# Patient Record
Sex: Male | Born: 1965 | Race: White | Hispanic: No | Marital: Married | Smoking: Never smoker
Health system: Southern US, Community
[De-identification: ages and names within clinical notes are randomized; demographics above are authoritative.]

## PROBLEM LIST (undated history)

## (undated) DIAGNOSIS — K219 Gastro-esophageal reflux disease without esophagitis: Secondary | ICD-10-CM

## (undated) DIAGNOSIS — N200 Calculus of kidney: Secondary | ICD-10-CM

## (undated) DIAGNOSIS — IMO0001 Reserved for inherently not codable concepts without codable children: Secondary | ICD-10-CM

## (undated) HISTORY — PX: WRIST SURGERY: SHX841

## (undated) HISTORY — DX: Calculus of kidney: N20.0

---

## 2000-09-04 ENCOUNTER — Emergency Department (HOSPITAL_COMMUNITY): Admission: EM | Admit: 2000-09-04 | Discharge: 2000-09-05 | Payer: Self-pay | Admitting: *Deleted

## 2003-05-01 ENCOUNTER — Emergency Department (HOSPITAL_COMMUNITY): Admission: EM | Admit: 2003-05-01 | Discharge: 2003-05-01 | Payer: Self-pay | Admitting: Emergency Medicine

## 2006-11-05 ENCOUNTER — Emergency Department (HOSPITAL_COMMUNITY): Admission: EM | Admit: 2006-11-05 | Discharge: 2006-11-05 | Payer: Self-pay | Admitting: Emergency Medicine

## 2008-02-13 ENCOUNTER — Ambulatory Visit: Payer: Self-pay | Admitting: Family Medicine

## 2008-02-13 DIAGNOSIS — M67919 Unspecified disorder of synovium and tendon, unspecified shoulder: Secondary | ICD-10-CM | POA: Insufficient documentation

## 2008-02-13 DIAGNOSIS — Z87442 Personal history of urinary calculi: Secondary | ICD-10-CM | POA: Insufficient documentation

## 2008-02-13 DIAGNOSIS — R209 Unspecified disturbances of skin sensation: Secondary | ICD-10-CM | POA: Insufficient documentation

## 2008-02-13 DIAGNOSIS — M719 Bursopathy, unspecified: Secondary | ICD-10-CM

## 2008-02-17 LAB — CONVERTED CEMR LAB
CO2: 30 meq/L (ref 19–32)
Calcium: 9.7 mg/dL (ref 8.4–10.5)
Direct LDL: 156 mg/dL
GFR calc Af Amer: 106 mL/min
GFR calc non Af Amer: 88 mL/min
Glucose, Bld: 91 mg/dL (ref 70–99)
Sodium: 139 meq/L (ref 135–145)
Total Bilirubin: 1 mg/dL (ref 0.3–1.2)
Total CHOL/HDL Ratio: 4.7
VLDL: 33 mg/dL (ref 0–40)
Vitamin B-12: 348 pg/mL (ref 211–911)

## 2008-05-22 ENCOUNTER — Ambulatory Visit: Payer: Self-pay | Admitting: Family Medicine

## 2008-05-24 LAB — CONVERTED CEMR LAB
HDL: 45.2 mg/dL (ref >39.0)
Triglycerides: 213 mg/dL (ref 0–149)
VLDL: 43 mg/dL — ABNORMAL HIGH (ref 0–40)

## 2008-08-24 ENCOUNTER — Ambulatory Visit: Payer: Self-pay | Admitting: Family Medicine

## 2008-08-29 LAB — CONVERTED CEMR LAB
Cholesterol: 240 mg/dL (ref 0–200)
Total CHOL/HDL Ratio: 5.1
Triglycerides: 162 mg/dL — ABNORMAL HIGH (ref 0–149)
VLDL: 32 mg/dL (ref 0–40)

## 2008-08-30 ENCOUNTER — Telehealth: Payer: Self-pay | Admitting: Family Medicine

## 2008-12-12 ENCOUNTER — Ambulatory Visit: Payer: Self-pay | Admitting: Family Medicine

## 2008-12-19 LAB — CONVERTED CEMR LAB
ALT: 30 units/L (ref 0–53)
AST: 26 units/L (ref 0–37)
Direct LDL: 151.5 mg/dL
VLDL: 48 mg/dL — ABNORMAL HIGH (ref 0–40)

## 2009-02-15 ENCOUNTER — Ambulatory Visit: Payer: Self-pay | Admitting: Family Medicine

## 2009-02-21 ENCOUNTER — Ambulatory Visit: Payer: Self-pay | Admitting: Family Medicine

## 2009-02-21 LAB — CONVERTED CEMR LAB
ALT: 57 units/L — ABNORMAL HIGH (ref 0–53)
AST: 34 units/L (ref 0–37)
Cholesterol: 173 mg/dL (ref 0–200)
VLDL: 22.2 mg/dL (ref 0.0–40.0)

## 2009-10-08 ENCOUNTER — Ambulatory Visit: Payer: Self-pay | Admitting: Family Medicine

## 2009-10-08 DIAGNOSIS — J309 Allergic rhinitis, unspecified: Secondary | ICD-10-CM | POA: Insufficient documentation

## 2013-01-09 ENCOUNTER — Emergency Department (HOSPITAL_COMMUNITY): Payer: No Typology Code available for payment source

## 2013-01-09 ENCOUNTER — Encounter (HOSPITAL_COMMUNITY): Payer: Self-pay | Admitting: *Deleted

## 2013-01-09 ENCOUNTER — Emergency Department (HOSPITAL_COMMUNITY)
Admission: EM | Admit: 2013-01-09 | Discharge: 2013-01-09 | Disposition: A | Discharge status: Home / Self Care | Payer: No Typology Code available for payment source | Attending: Emergency Medicine | Admitting: Emergency Medicine

## 2013-01-09 ENCOUNTER — Ambulatory Visit: Payer: Self-pay | Admitting: Family Medicine

## 2013-01-09 DIAGNOSIS — M549 Dorsalgia, unspecified: Secondary | ICD-10-CM | POA: Insufficient documentation

## 2013-01-09 DIAGNOSIS — R1013 Epigastric pain: Secondary | ICD-10-CM | POA: Insufficient documentation

## 2013-01-09 DIAGNOSIS — Z951 Presence of aortocoronary bypass graft: Secondary | ICD-10-CM | POA: Insufficient documentation

## 2013-01-09 DIAGNOSIS — Z8719 Personal history of other diseases of the digestive system: Secondary | ICD-10-CM | POA: Insufficient documentation

## 2013-01-09 DIAGNOSIS — M25519 Pain in unspecified shoulder: Secondary | ICD-10-CM | POA: Insufficient documentation

## 2013-01-09 DIAGNOSIS — R079 Chest pain, unspecified: Secondary | ICD-10-CM | POA: Insufficient documentation

## 2013-01-09 DIAGNOSIS — I2510 Atherosclerotic heart disease of native coronary artery without angina pectoris: Secondary | ICD-10-CM | POA: Insufficient documentation

## 2013-01-09 HISTORY — DX: Gastro-esophageal reflux disease without esophagitis: K21.9

## 2013-01-09 HISTORY — DX: Reserved for inherently not codable concepts without codable children: IMO0001

## 2013-01-09 LAB — CBC WITH DIFFERENTIAL/PLATELET
Basophils Absolute: 0 K/uL (ref 0.0–0.1)
Basophils Relative: 0 % (ref 0–1)
Eosinophils Absolute: 0 K/uL (ref 0.0–0.7)
Eosinophils Relative: 0 % (ref 0–5)
HCT: 46.4 % (ref 39.0–52.0)
Hemoglobin: 16.8 g/dL (ref 13.0–17.0)
Lymphocytes Relative: 12 % (ref 12–46)
Lymphs Abs: 1.4 10*3/uL (ref 0.7–4.0)
MCH: 30.5 pg (ref 26.0–34.0)
MCHC: 36.2 g/dL — ABNORMAL HIGH (ref 30.0–36.0)
MCV: 84.4 fL (ref 78.0–100.0)
Monocytes Absolute: 1.3 10*3/uL — ABNORMAL HIGH (ref 0.1–1.0)
Monocytes Relative: 11 % (ref 3–12)
Neutro Abs: 9.5 K/uL — ABNORMAL HIGH (ref 1.7–7.7)
Neutrophils Relative %: 77 % (ref 43–77)
Platelets: 249 K/uL (ref 150–400)
RBC: 5.5 MIL/uL (ref 4.22–5.81)
RDW: 12.9 % (ref 11.5–15.5)
WBC: 12.3 10*3/uL — ABNORMAL HIGH (ref 4.0–10.5)

## 2013-01-09 LAB — COMPREHENSIVE METABOLIC PANEL WITH GFR
AST: 21 U/L (ref 0–37)
CO2: 25 meq/L (ref 19–32)
Chloride: 101 meq/L (ref 96–112)
Creatinine, Ser: 1.29 mg/dL (ref 0.50–1.35)
GFR calc non Af Amer: 65 mL/min — ABNORMAL LOW (ref >90)
Potassium: 4.2 meq/L (ref 3.5–5.1)
Sodium: 139 meq/L (ref 135–145)
Total Bilirubin: 0.7 mg/dL (ref 0.3–1.2)
Total Protein: 8.1 g/dL (ref 6.0–8.3)

## 2013-01-09 LAB — COMPREHENSIVE METABOLIC PANEL
ALT: 30 U/L (ref 0–53)
Albumin: 4.7 g/dL (ref 3.5–5.2)
Alkaline Phosphatase: 80 U/L (ref 39–117)
BUN: 15 mg/dL (ref 6–23)
Calcium: 10.1 mg/dL (ref 8.4–10.5)
GFR calc Af Amer: 75 mL/min — ABNORMAL LOW (ref >90)
Glucose, Bld: 113 mg/dL — ABNORMAL HIGH (ref 70–99)

## 2013-01-09 MED ORDER — HYDROCODONE-ACETAMINOPHEN 5-325 MG PO TABS: 1.0000 | ORAL_TABLET | Freq: Four times a day (QID) | ORAL | Status: DC | PRN

## 2013-01-09 MED ORDER — ONDANSETRON 4 MG PO TBDP
4.0000 mg | ORAL_TABLET | Freq: Once | ORAL | Status: AC
  Administered 2013-01-09: 4 mg via ORAL
  Filled 2013-01-09: qty 1

## 2013-01-09 MED ORDER — MORPHINE SULFATE 4 MG/ML IJ SOLN
4.0000 mg | Freq: Once | INTRAMUSCULAR | Status: AC
  Administered 2013-01-09: 4 mg via INTRAVENOUS
  Filled 2013-01-09: qty 1

## 2013-01-09 MED ORDER — SODIUM CHLORIDE 0.9 % IV SOLN
80.0000 mg | Freq: Once | INTRAVENOUS | Status: AC
  Administered 2013-01-09: 80 mg via INTRAVENOUS
  Filled 2013-01-09: qty 80

## 2013-01-09 MED ORDER — ESOMEPRAZOLE MAGNESIUM 40 MG PO CPDR: 40.0000 mg | DELAYED_RELEASE_CAPSULE | Freq: Every day | ORAL | Status: DC

## 2013-01-09 MED ORDER — SODIUM CHLORIDE 0.9 % IV BOLUS (SEPSIS)
1000.0000 mL | Freq: Once | INTRAVENOUS | Status: AC
  Administered 2013-01-09: 1000 mL via INTRAVENOUS

## 2013-01-09 NOTE — ED Notes (Signed)
Pt states after supper last nite started having mid upper abdominal pain, back pain, bilateral shoulder pain.  Pt states he has reflux and has indigestion feeling.

## 2013-01-09 NOTE — ED Provider Notes (Signed)
History     CSN: [id number]  Arrival date & time 01/09/13  1344   First MD Initiated Contact with Patient 01/09/13 1650      Chief Complaint  Patient presents with  . Chest Pain    (Consider location/radiation/quality/duration/timing/severity/associated sxs/prior treatment) The history is provided by the patient. No language interpreter was used.    Gregory Dorsey he is a 47 year old male who presents the emergency department with chief complaint of epigastric pain, chest pain back and bilateral shoulder pain.  Patient is a poor historian and his wife frequently add to the history.  The patient states that yesterday he apparently 2 hours after eating a meal he had sudden onset of epigastric pain that started like his normal reflux.  The patient has reflux approximately 3-4 times a month for which he uses over-the-counter Zantac.  She states that the pain worsened in intensity.  The pain occurred suddenly while the patient was sitting at rest.  It was unrelieved by positional change.  Patient describes the pain as epigastric and substernal.  He states that it radiated to his bilateral shoulder blade area.  The patient had associated nausea and one episode of vomiting this morning without diarrhea.  He also describes discomfort in pressure substernally. The patient states that he was unable to get comfortable all night and frequently paced the house.  This did not relieve or worsen his symptoms.  The patient denies any diaphoresis.  He denies any radiation to the left jaw or arm.  He denies any associated shortness of breath.  He denies any paresthesia of the hands.  Patient is a nonsmoker.  No history of hypertension or diabetes.  He is only mildly overweight.  He is a father who has known coronary artery disease with 6 bypass grafts and one stent placement.  The family history of stroke or heart attack. 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  He frequently does heavy  lifting  With his work.  Past Medical History  Diagnosis Date  . Reflux     History reviewed. No pertinent past surgical history.  No family history on file.  History  Substance Use Topics  . Smoking status: Never Smoker   . Smokeless tobacco: Not on file  . Alcohol Use: No      Review of Systems Ten systems reviewed and are negative for acute change, except as noted in the HPI.   Allergies  Review of patient's allergies indicates no known allergies.  Home Medications   Current Outpatient Rx  Name  Route  Sig  Dispense  Refill  . ibuprofen (ADVIL,MOTRIN) 200 MG tablet   Oral   Take 800 mg by mouth every 6 (six) hours as needed for pain.         . vitamin C (ASCORBIC ACID) 500 MG tablet   Oral   Take 500 mg by mouth daily.           BP 153/93  Pulse 90  Temp(Src) 98.7 F (37.1 C) (Oral)  Resp 19  SpO2 98%  Physical Exam Physical Exam  Nursing note and vitals reviewed. Constitutional: He appears well-developed and well-nourished. No distress.  HENT:  Head: Normocephalic and atraumatic.  Eyes: Conjunctivae normal are normal. No scleral icterus.  Neck: Normal range of motion. Neck supple.  Cardiovascular: Normal rate, regular rhythm and normal heart sounds.   Pulmonary/Chest: Effort normal and breath sounds normal. No respiratory distress.  Abdominal: Soft. There is no tenderness.  Musculoskeletal: He exhibits no edema.  patient is tender to palpation bilaterally at the biceps insertion.  Nontender to palpation of the muscles of the back.   Neurological: He is alert.  Skin: Skin is warm and dry. He is not diaphoretic.  Psychiatric: His behavior is normal.    ED Course  Procedures (including critical care time)  Labs Reviewed  CBC WITH DIFFERENTIAL - Abnormal; Notable for the following:    WBC 12.3 (*)    MCHC 36.2 (*)    Neutro Abs 9.5 (*)    Monocytes Absolute 1.3 (*)    All other components within normal limits  COMPREHENSIVE METABOLIC PANEL  - Abnormal; Notable for the following:    Glucose, Bld 113 (*)    GFR calc non Af Amer 65 (*)    GFR calc Af Amer 75 (*)    All other components within normal limits  POCT I-STAT TROPONIN I   Dg Chest 2 View  01/09/2013  *RADIOLOGY REPORT*  Clinical Data: Chest pain and pressure Mordecai Maes today, some left arm numbness  CHEST - 2 VIEW  Comparison: None.  Findings: No active infiltrate or effusion is seen.  Mediastinal contours appear normal.  The heart is within normal limits in size. There are degenerative changes throughout the mid to lower thoracic spine.  IMPRESSION: No active lung disease.   Original Report Authenticated By: Dwyane Dee, M.D.      1. Epigastric abdominal pain   2. Chest pain   3. Back pain      Date: 01/09/2013  Rate: 100  Rhythm: normal sinus rhythm  QRS Axis: normal  Intervals: normal  ST/T Wave abnormalities: normal  Conduction Disutrbances: none  Narrative Interpretation:   Old EKG Reviewed:none ;  MDM  6:37 PM Filed Vitals:   01/09/13 1800  BP: 153/93  Pulse: 90  Temp:   Resp: 19    Patient here with symptoms of epigastric and chest pain.  The presentation is atypical for cardiac etiology.   She does have a history of DVT or pulmonary embolus.  The patient's pain has been relieved with morphine.  I have begun a protonix drip.  The patient does have tachycardia which I suspect is associated with his pain.  I do not suspect pulmonary embolus the patient has no associated cough, hemoptysis, shortness of breath.    Patient's labs are negative for acute coronary syndrome.  EKG is unremarkable.  Discussed case with Dr. Rosanne Ashing he feels the patient is safe for discharge at this time.  I must discuss this with the patient.  Patient will followup with his primary care physician.  I've advised the patient to stop using any kind of NSAID at this time and begin using a PPI. At this point patient appears safe for discharge.  Return precautions discussed in.  Patient  tests understanding and agrees with plan.        Arthor Captain, PA-C 01/09/13 2336

## 2013-01-10 NOTE — ED Provider Notes (Signed)
Medical screening examination/treatment/procedure(s) were performed by non-physician practitioner and as supervising physician I was immediately available for consultation/collaboration.   Alison Kubicki Y. Rania Prothero, MD 01/10/13 1518 

## 2013-01-12 ENCOUNTER — Ambulatory Visit (INDEPENDENT_AMBULATORY_CARE_PROVIDER_SITE_OTHER): Payer: No Typology Code available for payment source | Admitting: Family Medicine

## 2013-01-12 ENCOUNTER — Encounter: Payer: Self-pay | Admitting: Family Medicine

## 2013-01-12 VITALS — BP 150/90 | HR 84 | Temp 98.4°F | Ht 72.0 in | Wt 235.0 lb

## 2013-01-12 DIAGNOSIS — R030 Elevated blood-pressure reading, without diagnosis of hypertension: Secondary | ICD-10-CM | POA: Insufficient documentation

## 2013-01-12 MED ORDER — TRAMADOL HCL 50 MG PO TABS: 50.0000 mg | ORAL_TABLET | Freq: Three times a day (TID) | ORAL | Status: DC | PRN

## 2013-01-12 NOTE — Assessment & Plan Note (Signed)
Chronic.. Worsened by recent physical labor.  Will X-ray B shoulders.  No clear tendonitis or bursitis.  NSAIDs are contraindicated with GERD.  Tramadol for pain, hydrocodone for breakthru.  May be candidate for shoulder injection.

## 2013-01-12 NOTE — Progress Notes (Signed)
Subjective:    Patient ID: Gregory Dorsey, male    DOB: 1966-09-05, 47 y.o.   MRN: [id number]  HPI 47 year old male with high cholesterol presents for ER follow up of chest pain, back , B shoulder and epigastric abdominal pain. Seen at [facility] on 3/102 with the following complaints:  The patient states that on 3/9, 2 hours after eating a meal he had sudden onset of epigastric pain that started like his normal reflux. The patient has reflux approximately 3-4 times a month for which he uses over-the-counter Zantac. She states that the pain worsened in intensity. The pain occurred suddenly while the patient was sitting at rest. It was unrelieved by positional change. Patient describes the pain as epigastric and substernal. He states that it radiated to his bilateral shoulder blade area. The patient had associated nausea and one episode of vomiting this morning without diarrhea. He also describes discomfort in pressure substernally.  The patient states that he was unable to get comfortable all night and frequently paced the house. This did not relieve or worsen his symptoms. The patient denies any diaphoresis. He denies any radiation to the left jaw or arm. He denies any associated shortness of breath. He denies any paresthesia of the hands. Patient is a nonsmoker. No history of hypertension or diabetes. He is only mildly overweight.  He is a father who has known coronary artery disease with 6 bypass grafts and one stent placement. The family history of stroke or heart attack.  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   was taking advil 4 tab for body pain following this.   ER eval showed nml CXR, nml EKG. Nml labs except wbc of 12.  Given protonix and morphine.  Told to discontinue NSAIDs.Given hydrocodone for pain. Told to take nexium but it was too expensive.  Now he is feeling well but shoulder pain is continuing to bother him. He noted  the shoulder and back pain after yanking a tree toward him. He has history of bursitis and arthritis in past,chronic shoulder pain (last X-rays 10-15 years ago). No past surgeries. Shoulders hurt with any motion, more with int and ext rotation. Some grinding in both shoulder. Nml strength, occ numbness in hand when sleeping.    Has been having reflux issues every few days, especially if he is eating greast or tomato foods.   Review of Systems  Constitutional: Negative for fever and fatigue.  HENT: Negative for ear pain.   Eyes: Negative for pain.  Respiratory: Negative for shortness of breath.   Cardiovascular: Negative for chest pain, palpitations and leg swelling.  Gastrointestinal: Positive for nausea and abdominal pain. Negative for vomiting, diarrhea, constipation and anal bleeding.       Objective:   Physical Exam  Constitutional: Vital signs are normal. He appears well-developed and well-nourished.  HENT:  Head: Normocephalic.  Right Ear: Hearing normal.  Left Ear: Hearing normal.  Nose: Nose normal.  Mouth/Throat: Oropharynx is clear and moist and mucous membranes are normal.  Neck: Trachea normal. Carotid bruit is not present. No mass and no thyromegaly present.  Cardiovascular: Normal rate, regular rhythm and normal pulses.  Exam reveals no gallop, no distant heart sounds and no friction rub.   No murmur heard. No peripheral edema  Pulmonary/Chest: Effort normal and breath sounds normal. No respiratory distress.  Musculoskeletal:       Right shoulder: He exhibits decreased range of motion  and tenderness. He exhibits no swelling and no deformity.       Left shoulder: He exhibits decreased range of motion. He exhibits no tenderness and no deformity.       Cervical back: Normal. He exhibits normal range of motion, no tenderness and no bony tenderness.  Some B trapezius muscle ttp B shoulders: neg impingemnt sign, neg drop arm, pain with int and ext rot abduction.  ttp  over posterior subactominal space, no bursa or biceps tendon pain.   Skin: Skin is warm, dry and intact. No rash noted.  Psychiatric: He has a normal mood and affect. His speech is normal and behavior is normal. Thought content normal.          Assessment & Plan:

## 2013-01-12 NOTE — Patient Instructions (Addendum)
Return for [facility]X with fasting labs prior. Start prilosec 2 tabs of40 mg daily. Avoid reflux triggers. Use tramadol for pain twice daily as needed. Return for X-ray tommorow and further recommendations.

## 2013-01-12 NOTE — Assessment & Plan Note (Signed)
Reviewed trigger avoidance and lifestyle changes. Start prilosec 40 mg daily x 4-6 weeks, call if not improving in 2 weeks, wean off after 4-6 weeks.

## 2013-01-12 NOTE — Assessment & Plan Note (Signed)
Likely due to pain. Follow up at [facility] in 1-2 months

## 2013-01-13 ENCOUNTER — Ambulatory Visit (INDEPENDENT_AMBULATORY_CARE_PROVIDER_SITE_OTHER)
Admission: RE | Admit: 2013-01-13 | Discharge: 2013-01-13 | Disposition: A | Discharge status: Home / Self Care | Payer: No Typology Code available for payment source | Source: Ambulatory Visit | Attending: Family Medicine | Admitting: Family Medicine

## 2013-01-13 DIAGNOSIS — M25519 Pain in unspecified shoulder: Secondary | ICD-10-CM

## 2013-01-17 ENCOUNTER — Telehealth: Payer: Self-pay

## 2013-01-17 NOTE — Telephone Encounter (Signed)
Pt's wife left v/m that pt could not remember what nurse told pt about xrays of shoulder and f/u appt. pts wife request call back with info. Pt's wife will have pt sign designated party release form when pt seen at next appt. And then pts wife will call back for info.

## 2013-01-19 ENCOUNTER — Telehealth: Payer: Self-pay | Admitting: Family Medicine

## 2013-01-19 ENCOUNTER — Other Ambulatory Visit (INDEPENDENT_AMBULATORY_CARE_PROVIDER_SITE_OTHER): Payer: No Typology Code available for payment source

## 2013-01-19 LAB — COMPREHENSIVE METABOLIC PANEL
AST: 23 U/L (ref 0–37)
Albumin: 4.5 g/dL (ref 3.5–5.2)
BUN: 12 mg/dL (ref 6–23)
Calcium: 9.4 mg/dL (ref 8.4–10.5)
Chloride: 100 mEq/L (ref 96–112)
Potassium: 4 mEq/L (ref 3.5–5.1)
Sodium: 136 mEq/L (ref 135–145)
Total Protein: 7.3 g/dL (ref 6.0–8.3)

## 2013-01-19 LAB — LIPID PANEL: Triglycerides: 318 mg/dL — ABNORMAL HIGH (ref 0.0–149.0)

## 2013-01-19 LAB — LDL CHOLESTEROL, DIRECT: Direct LDL: 117.7 mg/dL

## 2013-01-19 NOTE — Telephone Encounter (Signed)
Message copied by Excell Seltzer on Thu Jan 19, 2013 12:04 AM ------      Message from: Alvina Chou      Created: Fri Jan 13, 2013  2:40 PM      Regarding: Lab orders for Thursday, 3.20.14       Patient is scheduled for [facility]X labs, please order future labs, Thanks , Terri       ------

## 2013-01-26 ENCOUNTER — Ambulatory Visit (INDEPENDENT_AMBULATORY_CARE_PROVIDER_SITE_OTHER): Payer: No Typology Code available for payment source | Admitting: Family Medicine

## 2013-01-26 ENCOUNTER — Encounter: Payer: Self-pay | Admitting: Family Medicine

## 2013-01-26 VITALS — BP 120/72 | HR 86 | Temp 98.2°F | Ht 72.0 in | Wt 234.0 lb

## 2013-01-26 DIAGNOSIS — E781 Pure hyperglyceridemia: Secondary | ICD-10-CM

## 2013-01-26 DIAGNOSIS — Z0000 Encounter for general adult medical examination without abnormal findings: Secondary | ICD-10-CM

## 2013-01-26 DIAGNOSIS — K219 Gastro-esophageal reflux disease without esophagitis: Secondary | ICD-10-CM

## 2013-01-26 DIAGNOSIS — E786 Lipoprotein deficiency: Secondary | ICD-10-CM

## 2013-01-26 DIAGNOSIS — R7303 Prediabetes: Secondary | ICD-10-CM | POA: Insufficient documentation

## 2013-01-26 DIAGNOSIS — R7309 Other abnormal glucose: Secondary | ICD-10-CM

## 2013-01-26 NOTE — Assessment & Plan Note (Signed)
Improved on omeprazole.

## 2013-01-26 NOTE — Assessment & Plan Note (Signed)
Counseled on lifestyle changes.

## 2013-01-26 NOTE — Patient Instructions (Addendum)
Make appt for eval and treatment with Dr. Patsy Lager for shoulder pain. Continue omeprazole for at least a total of 6 weeks then taper off. Decrease or stop sweet tea.. change to water, or make with splenda or crystal light. Decrease pasta bread, potatos. Try to increase exercise as able. Eat more vegetable fats in place of animal fats: replace butter with plive poil and canola oil. Eat 8 almonds a day for a snack. Decrease cheese, sausage, bacon.

## 2013-01-26 NOTE — Progress Notes (Signed)
Subjective:    Patient ID: Gregory Dorsey, male    DOB: January 19, 1966, 47 y.o.   MRN: [id number]  HPI  The patient is here for annual wellness exam and preventative care.    Elevated BP at last OV: Likely due to pain. BP in nml range today.   B shoulder pain: X-rays suggested arthritis. Recommended referral to [facility] or [facility] for further eval and treat.  The shoulder pain is not intermittant but still fairly severe when it occurs. He is not using any medication regularly.  GERD: On (omeprazole) prilosec 40 mg daily for tha last several weeks. He has noted significant improvement, only one episode of reflux since starting this med. This episode occurred after having trigger food.  Elevated Cholesterol: On no med Lab Results  Component Value Date   CHOL 198 01/19/2013   HDL 37.70* 01/19/2013   LDLCALC 98 02/15/2009   LDLDIRECT 117.7 01/19/2013   TRIG 318.0* 01/19/2013   CHOLHDL 5 01/19/2013     Diet compliance: Moderate Exercise: very active at work and home Other complaints:      Review of Systems  Constitutional: Negative for fever, fatigue and unexpected weight change.  HENT: Negative for ear pain, congestion, sore throat, rhinorrhea, trouble swallowing and postnasal drip.   Eyes: Negative for pain.  Respiratory: Negative for cough, shortness of breath and wheezing.   Cardiovascular: Negative for chest pain, palpitations and leg swelling.  Gastrointestinal: Negative for nausea, abdominal pain, diarrhea, constipation and blood in stool.  Genitourinary: Negative for dysuria, urgency, hematuria, discharge, penile swelling, scrotal swelling, difficulty urinating, penile pain and testicular pain.  Musculoskeletal: Positive for arthralgias.  Skin: Negative for rash.  Neurological: Negative for syncope, weakness, light-headedness, numbness and headaches.  Psychiatric/Behavioral: Negative for behavioral problems and dysphoric mood. The patient is not nervous/anxious.        Objective:    Physical Exam  Constitutional: He appears well-developed and well-nourished.  Non-toxic appearance. He does not appear ill. No distress.  HENT:  Head: Normocephalic and atraumatic.  Right Ear: Hearing, tympanic membrane, external ear and ear canal normal.  Left Ear: Hearing, tympanic membrane, external ear and ear canal normal.  Nose: Nose normal.  Mouth/Throat: Uvula is midline, oropharynx is clear and moist and mucous membranes are normal.  Eyes: Conjunctivae, EOM and lids are normal. Pupils are equal, round, and reactive to light. No foreign bodies found.  Neck: Trachea normal, normal range of motion and phonation normal. Neck supple. Carotid bruit is not present. No mass and no thyromegaly present.  Cardiovascular: Normal rate, regular rhythm, S1 normal, S2 normal, intact distal pulses and normal pulses.  Exam reveals no gallop.   No murmur heard. Pulmonary/Chest: Breath sounds normal. He has no wheezes. He has no rhonchi. He has no rales.  Abdominal: Soft. Normal appearance and bowel sounds are normal. There is no hepatosplenomegaly. There is no tenderness. There is no rebound, no guarding and no CVA tenderness. No hernia. Hernia confirmed negative in the right inguinal area and confirmed negative in the left inguinal area.  Genitourinary: Testes normal and penis normal. Right testis shows no mass and no tenderness. Left testis shows no mass and no tenderness. No paraphimosis or penile tenderness.  Lymphadenopathy:    He has no cervical adenopathy.       Right: No inguinal adenopathy present.       Left: No inguinal adenopathy present.  Neurological: He is alert. He has normal strength and normal reflexes. No cranial nerve deficit or sensory  deficit. Gait normal.  Skin: Skin is warm, dry and intact. No rash noted.  Psychiatric: He has a normal mood and affect. His speech is normal and behavior is normal. Judgment normal.          Assessment & Plan:  The patient's preventative  maintenance and recommended screening tests for an annual wellness exam were reviewed in full today. Brought up to date unless services declined.  Counselled on the importance of diet, exercise, and its role in overall health and mortality. The patient's FH and SH was reviewed, including their home life, tobacco status, and drug and alcohol status.   Vaccines:uptodate with Td No family history of early prostate/colon cancer Not interested in STD testing

## 2013-02-06 ENCOUNTER — Encounter: Payer: Self-pay | Admitting: Family Medicine

## 2013-02-06 ENCOUNTER — Ambulatory Visit (INDEPENDENT_AMBULATORY_CARE_PROVIDER_SITE_OTHER): Payer: No Typology Code available for payment source | Admitting: Family Medicine

## 2013-02-06 VITALS — BP 118/80 | HR 72 | Temp 97.4°F | Ht 72.0 in | Wt 230.0 lb

## 2013-02-06 DIAGNOSIS — IMO0002 Reserved for concepts with insufficient information to code with codable children: Secondary | ICD-10-CM

## 2013-02-06 DIAGNOSIS — M719 Bursopathy, unspecified: Secondary | ICD-10-CM

## 2013-02-06 DIAGNOSIS — M7550 Bursitis of unspecified shoulder: Secondary | ICD-10-CM

## 2013-02-06 DIAGNOSIS — M75100 Unspecified rotator cuff tear or rupture of unspecified shoulder, not specified as traumatic: Secondary | ICD-10-CM

## 2013-02-06 DIAGNOSIS — M7580 Other shoulder lesions, unspecified shoulder: Secondary | ICD-10-CM

## 2013-02-06 DIAGNOSIS — M67919 Unspecified disorder of synovium and tendon, unspecified shoulder: Secondary | ICD-10-CM

## 2013-02-06 MED ORDER — DICLOFENAC SODIUM 75 MG PO TBEC: 75.0000 mg | DELAYED_RELEASE_TABLET | Freq: Two times a day (BID) | ORAL | Status: DC

## 2013-02-06 NOTE — Progress Notes (Signed)
[name] [address] [place] [address] Phone: [phone] Fax: [phone]  Date:  02/06/2013   Name:  Gregory Dorsey   DOB:  [birth year]   MRN:  [id number] Gender: male Age: 47 y.o.  Primary Physician:  [name], MD  Evaluating MD: [name], MD   Chief Complaint: Shoulder Pain   History of Present Illness:  Gregory Dorsey is a 47 y.o. pleasant patient who presents with the following:  L > R - shoulder pain for many years, cannot throw a ball. Pop and crack. 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 He is the sole employee.   Larey Seat out of a tree as a teenager, broke collarbone. O/w no trauma history.  No much neck pain. No radiculopathy. The patient also has a very significant painful arc of motion bilaterally, and he also has pain with terminal internal range of motion bilaterally as well. His LEFT shoulder is currently worse than the RIGHT, right now either one is particularly painful.  He cannot do a push-up.  Patient Active Problem List  Diagnosis  . RHINITIS  . ROTATOR CUFF SYNDROME  . NUMBNESS, HAND  . NEPHROLITHIASIS, HX OF  . GERD (gastroesophageal reflux disease)  . Bilateral shoulder pain  . Prediabetes  . High triglycerides  . Low HDL (under 40)    Past Medical History  Diagnosis Date  . Reflux     No past surgical history on file.  History   Social History  . Marital Status: Married    Spouse Name: N/A    Number of Children: N/A  . Years of Education: N/A   Occupational History  . Not on file.   Social History Main Topics  . Smoking status: Never Smoker   . Smokeless tobacco: Not on file  . Alcohol Use: No  . Drug Use: No  . Sexually Active: Not on file   Other Topics Concern  . Not on file   Social History Narrative  . No narrative on file    Family History  Problem Relation Age of Onset  . Arthritis Mother   . Heart  disease Father 37    CAD    No Known Allergies  Medication list has been reviewed and updated.  Outpatient Prescriptions Prior to Visit  Medication Sig Dispense Refill  . omeprazole (PRILOSEC) 40 MG capsule Take 40 mg by mouth daily.      . vitamin C (ASCORBIC ACID) 500 MG tablet Take 500 mg by mouth daily.      . traMADol (ULTRAM) 50 MG tablet Take 1 tablet (50 mg total) by mouth every 8 (eight) hours as needed for pain.  30 tablet  0   No facility-administered medications prior to visit.    Review of Systems:   GEN: No fevers, chills. Nontoxic. Primarily MSK c/o today. MSK: Detailed in the HPI GI: tolerating PO intake without difficulty Neuro: No numbness, parasthesias, or tingling associated. Otherwise the pertinent positives of the ROS are noted above.    Physical Examination: BP 118/80  Pulse 72  Temp(Src) 97.4 F (36.3 C) (Oral)  Ht 6' (1.829 m)  Wt 230 lb (104.327 kg)  BMI 31.19 kg/m2  SpO2 97%  Ideal Body Weight: Weight in (lb) to have BMI = 25: 183.9   GEN: Well-developed,well-nourished,in no acute distress; alert,appropriate and cooperative throughout examination HEENT: Normocephalic and atraumatic without obvious abnormalities. Ears, externally  no deformities PULM: Breathing comfortably in no respiratory distress EXT: No clubbing, cyanosis, or edema PSYCH: Normally interactive. Cooperative during the interview. Pleasant. Friendly and conversant. Not anxious or depressed appearing. Normal, full affect.  Shoulder: Inspection: No muscle wasting or winging Ecchymosis/edema: neg  AC joint, scapula, clavicle: NT Cervical spine: NT, full ROM Spurling's: neg Abduction: full, 5/5 Flexion: full, 5/5 IR, full, lift-off: 5/5 ER at neutral: full, 5/5 AC crossover: pos Neer: pos Hawkins: pos O'brien's is pos B Drop Test: neg Empty Can: pos Supraspinatus insertion: mild-mod T Bicipital groove: NT Speed's: neg Yergason's: neg Sulcus sign: neg Scapular  dyskinesis: none C5-T1 intact  Neuro: Sensation intact Grip 5/5  Dg Shoulder Right  01/13/2013  *RADIOLOGY REPORT*  Clinical Data: History of shoulder pain.  RIGHT SHOULDER - 2+ VIEW  Comparison: None.  Findings: There is narrowing of the inferior aspect of the right AC joint with minimal spurring of the inferior aspect of the acromion process on the lateral image.  Glenohumeral joint appears preserved.  No fracture or bony destruction is evident.  No calcific bursitis or calcific tendonitis is evident.  No cervical rib is seen. No cervical rib is seen.  IMPRESSION: Narrowing of inferior aspect of the right AC joint with minimal spurring of inferior aspect of acromion process on lateral image. No other abnormalities are evident.   Original Report Authenticated By: Onalee Hua Call    Dg Shoulder Left  01/13/2013  *RADIOLOGY REPORT*  Clinical Data: Left shoulder pain.  LEFT SHOULDER - 2+ VIEW  Comparison: None.  Findings: Glenohumeral joint is normal.  Slight hypertrophy of the distal clavicle with the undersurface osteophytes which might predispose to impingement.  No soft tissue calcification.  IMPRESSION: No acute abnormality.  Degenerative changes at the [facility] joint might predispose to impingement.   Original Report Authenticated By: [name], M.D.     -- Xrays independently reviewed. C/w mild ac arthropathy only.   Assessment and Plan:  Rotator cuff tendonitis, unspecified laterality  Subacromial bursitis, unspecified laterality  Challenging case, where the patient is constantly moving very heavy tires and tractor tires all day long 5 days a week. I suspect that over time this is caused a chronic tendinopathy of his rotator cuff and some chronic bursitis. His a.c. Arthropathy is not impressive on x-ray with mildly symptomatic clinically.  Pain with doing the pushups and a positive O'Brien. He may have a labral tear as well.  I discussed the anatomy with the patient. He basically doesn't have  time to pursue any potential pathology that could require surgery and does not have time and could not afford to take time off of work for operative intervention.  We reviewed the [facility] shoulder collaboration rehabilitation protocols, which I suspect will be helpful, and I think that he will likely need to do them indefinitely. If he has any trouble at all, we can start him on physical therapy.  I also offered him some diclofenac, to change class of anti-inflammatories.  Orders Today:  No orders of the defined types were placed in this encounter.    Updated Medication List: (Includes new medications, updates to list, dose adjustments) Meds ordered this encounter  Medications  . diclofenac (VOLTAREN) 75 MG EC tablet    Sig: Take 1 tablet (75 mg total) by mouth 2 (two) times daily.    Dispense:  60 tablet    Refill:  3    Medications Discontinued: There are no discontinued medications.    Signed, Elpidio Galea. Maryama Kuriakose, MD  02/06/2013 8:22 AM

## 2014-04-03 ENCOUNTER — Ambulatory Visit (INDEPENDENT_AMBULATORY_CARE_PROVIDER_SITE_OTHER): Payer: No Typology Code available for payment source | Admitting: Internal Medicine

## 2014-04-03 ENCOUNTER — Telehealth: Payer: Self-pay | Admitting: Family Medicine

## 2014-04-03 ENCOUNTER — Encounter (INDEPENDENT_AMBULATORY_CARE_PROVIDER_SITE_OTHER): Payer: Self-pay

## 2014-04-03 ENCOUNTER — Encounter: Payer: Self-pay | Admitting: Internal Medicine

## 2014-04-03 ENCOUNTER — Ambulatory Visit (INDEPENDENT_AMBULATORY_CARE_PROVIDER_SITE_OTHER)
Admission: RE | Admit: 2014-04-03 | Discharge: 2014-04-03 | Disposition: A | Discharge status: Home / Self Care | Payer: No Typology Code available for payment source | Source: Ambulatory Visit | Attending: Internal Medicine | Admitting: Internal Medicine

## 2014-04-03 VITALS — BP 128/78 | HR 85 | Temp 98.0°F | Wt 216.0 lb

## 2014-04-03 DIAGNOSIS — M79609 Pain in unspecified limb: Secondary | ICD-10-CM

## 2014-04-03 DIAGNOSIS — M79671 Pain in right foot: Secondary | ICD-10-CM

## 2014-04-03 NOTE — Progress Notes (Signed)
Pre visit review using our clinic review tool, if applicable. No additional management support is needed unless otherwise documented below in the visit note. 

## 2014-04-03 NOTE — Patient Instructions (Addendum)
Foot Sprain The muscles and cord like structures which attach muscle to bone (tendons) that surround the feet are made up of units. A foot sprain can occur at the weakest spot in any of these units. This condition is most often caused by injury to or overuse of the foot, as from playing contact sports, or aggravating a previous injury, or from poor conditioning, or obesity. SYMPTOMS  Pain with movement of the foot.  Tenderness and swelling at the injury site.  Loss of strength is present in moderate or severe sprains. THE THREE GRADES OR SEVERITY OF FOOT SPRAIN ARE:  Mild (Grade I): Slightly pulled muscle without tearing of muscle or tendon fibers or loss of strength.  Moderate (Grade II): Tearing of fibers in a muscle, tendon, or at the attachment to bone, with small decrease in strength.  Severe (Grade III): Rupture of the muscle-tendon-bone attachment, with separation of fibers. Severe sprain requires surgical repair. Often repeating (chronic) sprains are caused by overuse. Sudden (acute) sprains are caused by direct injury or over-use. DIAGNOSIS  Diagnosis of this condition is usually by your own observation. If problems continue, a caregiver may be required for further evaluation and treatment. X-rays may be required to make sure there are not breaks in the bones (fractures) present. Continued problems may require physical therapy for treatment. PREVENTION  Use strength and conditioning exercises appropriate for your sport.  Warm up properly prior to working out.  Use athletic shoes that are made for the sport you are participating in.  Allow adequate time for healing. Early return to activities makes repeat injury more likely, and can lead to an unstable arthritic foot that can result in prolonged disability. Mild sprains generally heal in 3 to 10 days, with moderate and severe sprains taking 2 to 10 weeks. Your caregiver can help you determine the proper time required for  healing. HOME CARE INSTRUCTIONS   Apply ice to the injury for 15-20 minutes, 03-04 times per day. Put the ice in a plastic bag and place a towel between the bag of ice and your skin.  An elastic wrap (like an Ace bandage) may be used to keep swelling down.  Keep foot above the level of the heart, or at least raised on a footstool, when swelling and pain are present.  Try to avoid use other than gentle range of motion while the foot is painful. Do not resume use until instructed by your caregiver. Then begin use gradually, not increasing use to the point of pain. If pain does develop, decrease use and continue the above measures, gradually increasing activities that do not cause discomfort, until you gradually achieve normal use.  Use crutches if and as instructed, and for the length of time instructed.  Keep injured foot and ankle wrapped between treatments.  Massage foot and ankle for comfort and to keep swelling down. Massage from the toes up towards the knee.  Only take over-the-counter or prescription medicines for pain, discomfort, or fever as directed by your caregiver. SEEK IMMEDIATE MEDICAL CARE IF:   Your pain and swelling increase, or pain is not controlled with medications.  You have loss of feeling in your foot or your foot turns cold or blue.  You develop new, unexplained symptoms, or an increase of the symptoms that brought you to your caregiver. MAKE SURE YOU:   Understand these instructions.  Will watch your condition.  Will get help right away if you are not doing well or get worse. Document Released:   04/10/2002 Document Revised: 01/11/2012 Document Reviewed: 06/07/2008 [employer] Patient Information 2014 [employer].  

## 2014-04-03 NOTE — Progress Notes (Signed)
Subjective:    Patient ID: Gregory Dorsey, male    DOB: [birth year], 48 y.o.   MRN: [id number]  HPI  Pt presents to the clinic today with c/o pain in the ball of right foot. This started 2 months ago. He describes the pain as sharp. The pain is worse when he puts pressure on his foot. He denies any specific injury to the area. He denies numbness and tingling in the foot. He has not tried anything OTC.  Review of Systems      Past Medical History  Diagnosis Date  . Reflux     No current outpatient prescriptions on file.   No current facility-administered medications for this visit.    No Known Allergies  Family History  Problem Relation Age of Onset  . Arthritis Mother   . Heart disease Father 21    CAD    History   Social History  . Marital Status: Married    Spouse Name: N/A    Number of Children: N/A  . Years of Education: N/A   Occupational History  . Not on file.   Social History Main Topics  . Smoking status: Never Smoker   . Smokeless tobacco: Not on file  . Alcohol Use: No  . Drug Use: No  . Sexual Activity: Not on file   Other Topics Concern  . Not on file   Social History Narrative  . No narrative on file     Constitutional: Denies fever, malaise, fatigue, headache or abrupt weight changes.  Musculoskeletal: Pt reports pain in ball of foot. Denies decrease in range of motion, difficulty with gait, muscle pain or joint swelling.    No other specific complaints in a complete review of systems (except as listed in HPI above).  Objective:   Physical Exam   BP 128/78  Pulse 85  Temp(Src) 98 F (36.7 C) (Oral)  Wt 216 lb (97.977 kg)  SpO2 98% Wt Readings from Last 3 Encounters:  04/03/14 216 lb (97.977 kg)  02/06/13 230 lb (104.327 kg)  01/26/13 234 lb (106.142 kg)    General: Appears his stated age, well developed, well nourished in NAD.  Cardiovascular: Normal rate and rhythm. S1,S2 noted.  No murmur, rubs or gallops noted. No JVD  or BLE edema. No carotid bruits noted. Pulmonary/Chest: Normal effort and positive vesicular breath sounds. No respiratory distress. No wheezes, rales or ronchi noted.  Musculoskeletal: Normal flexion, extension and rotation of the right ankle. Tender to palpation on the ball of the foot. Normal gait.   BMET    Component Value Date/Time   NA 136 01/19/2013 0746   K 4.0 01/19/2013 0746   CL 100 01/19/2013 0746   CO2 28 01/19/2013 0746   GLUCOSE 102* 01/19/2013 0746   BUN 12 01/19/2013 0746   CREATININE 0.9 01/19/2013 0746   CALCIUM 9.4 01/19/2013 0746   GFRNONAA 65* 01/09/2013 1401   GFRAA 75* 01/09/2013 1401    Lipid Panel     Component Value Date/Time   CHOL 198 01/19/2013 0746   TRIG 318.0* 01/19/2013 0746   HDL 37.70* 01/19/2013 0746   CHOLHDL 5 01/19/2013 0746   VLDL 63.6* 01/19/2013 0746   LDLCALC 98 02/15/2009 1005    CBC    Component Value Date/Time   WBC 12.3* 01/09/2013 1401   RBC 5.50 01/09/2013 1401   HGB 16.8 01/09/2013 1401   HCT 46.4 01/09/2013 1401   PLT 249 01/09/2013 1401   MCV 84.4 01/09/2013 1401  MCH 30.5 01/09/2013 1401   MCHC 36.2* 01/09/2013 1401   RDW 12.9 01/09/2013 1401   LYMPHSABS 1.4 01/09/2013 1401   MONOABS 1.3* 01/09/2013 1401   EOSABS 0.0 01/09/2013 1401   BASOSABS 0.0 01/09/2013 1401    Hgb A1C No results found for this basename: HGBA1C        Assessment & Plan:   Pain in right foot:  Will check xray to r/o fracture Advised him to try tylenol OTC May need referral to ortho  Will follow up after xray

## 2014-04-03 NOTE — Telephone Encounter (Signed)
Okay to schedule at that time

## 2014-04-03 NOTE — Telephone Encounter (Signed)
Dr Diona Browner Pt wanted to schedule his cpx.  Pt wanted 7:30 or 8am appointment is it ok to schedule cpx that early in morning.  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

## 2014-04-04 NOTE — Telephone Encounter (Signed)
Appointment 6/30 pt aware of appointment

## 2014-04-13 ENCOUNTER — Encounter: Payer: Self-pay | Admitting: Podiatry

## 2014-04-13 ENCOUNTER — Ambulatory Visit (INDEPENDENT_AMBULATORY_CARE_PROVIDER_SITE_OTHER): Payer: 59

## 2014-04-13 ENCOUNTER — Ambulatory Visit (INDEPENDENT_AMBULATORY_CARE_PROVIDER_SITE_OTHER): Payer: 59 | Admitting: Podiatry

## 2014-04-13 VITALS — BP 146/90 | HR 77 | Resp 16 | Ht 72.0 in | Wt 215.0 lb

## 2014-04-13 DIAGNOSIS — D219 Benign neoplasm of connective and other soft tissue, unspecified: Secondary | ICD-10-CM

## 2014-04-13 DIAGNOSIS — D3610 Benign neoplasm of peripheral nerves and autonomic nervous system, unspecified: Secondary | ICD-10-CM

## 2014-04-13 DIAGNOSIS — M779 Enthesopathy, unspecified: Secondary | ICD-10-CM

## 2014-04-13 MED ORDER — TRIAMCINOLONE ACETONIDE 10 MG/ML IJ SUSP
10.0000 mg | Freq: Once | INTRAMUSCULAR | Status: AC
  Administered 2014-04-13: 10 mg

## 2014-04-13 NOTE — Progress Notes (Signed)
Subjective:     Patient ID: Gregory Dorsey, male   DOB: 29-Apr-1966, 48 y.o.   MRN: [id number]  Foot Pain   patient states that the bottom of the right foot has been sore and going on for about 2-3 months. He does work on [employer] and has to squat a lot which is probably causing irritation  Review of Systems  All other systems reviewed and are negative.      Objective:   Physical Exam  Nursing note and vitals reviewed. Constitutional: He is oriented to person, place, and time.  Cardiovascular: Intact distal pulses.   Musculoskeletal: Normal range of motion.  Neurological: He is oriented to person, place, and time.  Skin: Skin is warm.   neurovascular status is intact with range of motion adequate subtalar midtarsal joint equinus condition noted of both feet and digits are well perfused on both feet with a high arch on weightbearing noted. Patient is found to have discomfort in the second metatarsophalangeal joint and mild discomfort in the third    Assessment:     Probable capsulitis secondary to foot structure and types of activities that he does on a daily basis    Plan:     H&P and x-rays reviewed with patient. The proximal nerve block explained risk and then aspirated the second MPJ getting out of a small amount of clear fluid and injected with half a cc of dexamethasone Kenalog combination and applied thick plantar pad to the foot.

## 2014-04-13 NOTE — Progress Notes (Signed)
   Subjective:    Patient ID: Gregory Dorsey, male    DOB: 16-Aug-1966, 48 y.o.   MRN: [id number]  HPI Comments: Been having foot pain in the ball of my right foot , its been going on now for about 2 months. Its on the bottom between the 2/3/and 4th met, it throbs , burns , stings and goes to the toes and to the top of the foot by the end of the day   Foot Pain      Review of Systems  All other systems reviewed and are negative.      Objective:   Physical Exam        Assessment & Plan:

## 2014-04-20 ENCOUNTER — Encounter: Payer: Self-pay | Admitting: Podiatry

## 2014-04-20 ENCOUNTER — Ambulatory Visit (INDEPENDENT_AMBULATORY_CARE_PROVIDER_SITE_OTHER): Payer: 59 | Admitting: Podiatry

## 2014-04-20 DIAGNOSIS — L84 Corns and callosities: Secondary | ICD-10-CM

## 2014-04-20 DIAGNOSIS — M779 Enthesopathy, unspecified: Secondary | ICD-10-CM

## 2014-04-20 MED ORDER — TRIAMCINOLONE ACETONIDE 10 MG/ML IJ SUSP
10.0000 mg | Freq: Once | INTRAMUSCULAR | Status: AC
  Administered 2014-04-20: 10 mg

## 2014-04-20 NOTE — Progress Notes (Signed)
Subjective:     Patient ID: Gregory Dorsey, male   DOB: 07-05-1966, 48 y.o.   MRN: [id number]  HPI patient states I'm feeling a lot better with my right foot but still mild pain and I'm getting a lot of inflammation on the outside of my left foot lesions that become painful  Review of Systems     Objective:   Physical Exam Neurovascular status intact with no health history changes and inflammation second MPJ right that is resolving with inflammation around fifth metatarsal head left with fluid buildup and keratotic lesion formation    Assessment:     Improving capsulitis right second MPJ and inflammation with fluid buildup fifth MPJ left    Plan:     Reviewed both conditions and advised on rigid tight shoe gear to control the right foot and symptoms that may recur which I explained and he may need to be seen again for that. For the left foot I injected the fifth MPJ 3 mg Kenalog 5 mg I can Marcaine mixture and then went ahead and debrided the lesion reappoint as needed

## 2014-05-01 ENCOUNTER — Ambulatory Visit (INDEPENDENT_AMBULATORY_CARE_PROVIDER_SITE_OTHER): Payer: No Typology Code available for payment source | Admitting: Family Medicine

## 2014-05-01 ENCOUNTER — Encounter: Payer: Self-pay | Admitting: *Deleted

## 2014-05-01 ENCOUNTER — Encounter: Payer: Self-pay | Admitting: Family Medicine

## 2014-05-01 VITALS — BP 130/80 | HR 68 | Temp 97.8°F | Ht 71.26 in | Wt 212.5 lb

## 2014-05-01 DIAGNOSIS — R7309 Other abnormal glucose: Secondary | ICD-10-CM

## 2014-05-01 DIAGNOSIS — E781 Pure hyperglyceridemia: Secondary | ICD-10-CM

## 2014-05-01 DIAGNOSIS — Z0000 Encounter for general adult medical examination without abnormal findings: Secondary | ICD-10-CM

## 2014-05-01 DIAGNOSIS — Z23 Encounter for immunization: Secondary | ICD-10-CM

## 2014-05-01 DIAGNOSIS — R7303 Prediabetes: Secondary | ICD-10-CM

## 2014-05-01 DIAGNOSIS — K219 Gastro-esophageal reflux disease without esophagitis: Secondary | ICD-10-CM

## 2014-05-01 LAB — COMPREHENSIVE METABOLIC PANEL
ALK PHOS: 62 U/L (ref 39–117)
ALT: 23 U/L (ref 0–53)
AST: 21 U/L (ref 0–37)
Albumin: 5 g/dL (ref 3.5–5.2)
BILIRUBIN TOTAL: 1.2 mg/dL (ref 0.2–1.2)
BUN: 18 mg/dL (ref 6–23)
CO2: 28 mEq/L (ref 19–32)
Calcium: 9.6 mg/dL (ref 8.4–10.5)
Chloride: 102 mEq/L (ref 96–112)
Creatinine, Ser: 1 mg/dL (ref 0.4–1.5)
GFR: 88.88 mL/min (ref >60.00)
Glucose, Bld: 92 mg/dL (ref 70–99)
Potassium: 4 mEq/L (ref 3.5–5.1)
SODIUM: 137 meq/L (ref 135–145)
Total Protein: 7.9 g/dL (ref 6.0–8.3)

## 2014-05-01 LAB — LIPID PANEL
Cholesterol: 255 mg/dL — ABNORMAL HIGH (ref 0–200)
HDL: 60.9 mg/dL (ref >39.00)
LDL CALC: 167 mg/dL — AB (ref 0–99)
NONHDL: 194.1
Total CHOL/HDL Ratio: 4
Triglycerides: 135 mg/dL (ref 0.0–149.0)
VLDL: 27 mg/dL (ref 0.0–40.0)

## 2014-05-01 NOTE — Assessment & Plan Note (Signed)
Stable control on no med, trigger avoidance.

## 2014-05-01 NOTE — Assessment & Plan Note (Signed)
Due for re-eval. 

## 2014-05-01 NOTE — Progress Notes (Signed)
Pre visit review using our clinic review tool, if applicable. No additional management support is needed unless otherwise documented below in the visit note. 

## 2014-05-01 NOTE — Patient Instructions (Signed)
Stop at lab on way out. Given TDap today.

## 2014-05-01 NOTE — Progress Notes (Signed)
The patient is here for annual wellness exam and preventative care.   Elevated BP at last OV:  BP Readings from Last 3 Encounters:  05/01/14 130/80  04/13/14 146/90  04/03/14 128/78   GERD: well controlled on no med. Avoids triggers.  Elevated Cholesterol: Due for re-eval. Lab Results  Component Value Date   CHOL 198 01/19/2013   HDL 37.70* 01/19/2013   LDLCALC 98 02/15/2009   LDLDIRECT 117.7 01/19/2013   TRIG 318.0* 01/19/2013   CHOLHDL 5 01/19/2013  Diet compliance: Moderate  Exercise: very active at work and home    Review of Systems  Constitutional: Negative for fever, fatigue and unexpected weight change.  HENT: Negative for ear pain, congestion, sore throat, rhinorrhea, trouble swallowing and postnasal drip.  Eyes: Negative for pain.  Respiratory: Negative for cough, shortness of breath and wheezing.  Cardiovascular: Negative for chest pain, palpitations and leg swelling.  Gastrointestinal: Negative for nausea, abdominal pain, diarrhea, constipation and blood in stool.  Genitourinary: Negative for dysuria, urgency, hematuria, discharge, penile swelling, scrotal swelling, difficulty urinating, penile pain and testicular pain.  Musculoskeletal: Positive for arthralgias.  Skin: Negative for rash.  Neurological: Negative for syncope, weakness, light-headedness, numbness and headaches.  Psychiatric/Behavioral: Negative for behavioral problems and dysphoric mood. The patient is not nervous/anxious.  Objective:   Physical Exam  Constitutional: He appears well-developed and well-nourished. Non-toxic appearance. He does not appear ill. No distress.  HENT:  Head: Normocephalic and atraumatic.  Right Ear: Hearing, tympanic membrane, external ear and ear canal normal.  Left Ear: Hearing, tympanic membrane, external ear and ear canal normal.  Nose: Nose normal.  Mouth/Throat: Uvula is midline, oropharynx is clear and moist and mucous membranes are normal.  Eyes: Conjunctivae, EOM and  lids are normal. Pupils are equal, round, and reactive to light. No foreign bodies found.  Neck: Trachea normal, normal range of motion and phonation normal. Neck supple. Carotid bruit is not present. No mass and no thyromegaly present.  Cardiovascular: Normal rate, regular rhythm, S1 normal, S2 normal, intact distal pulses and normal pulses. Exam reveals no gallop.  No murmur heard.  Pulmonary/Chest: Breath sounds normal. He has no wheezes. He has no rhonchi. He has no rales.  Abdominal: Soft. Normal appearance and bowel sounds are normal. There is no hepatosplenomegaly. There is no tenderness. There is no rebound, no guarding and no CVA tenderness. No hernia. Hernia confirmed negative in the right inguinal area and confirmed negative in the left inguinal area.  Genitourinary: Testes normal and penis normal. Right testis shows no mass and no tenderness. Left testis shows no mass and no tenderness. No paraphimosis or penile tenderness.  Lymphadenopathy:  He has no cervical adenopathy.  Right: No inguinal adenopathy present.  Left: No inguinal adenopathy present.  Neurological: He is alert. He has normal strength and normal reflexes. No cranial nerve deficit or sensory deficit. Gait normal.  Skin: Skin is warm, dry and intact. No rash noted.  Psychiatric: He has a normal mood and affect. His speech is normal and behavior is normal. Judgment normal.  Assessment & Plan:   The patient's preventative maintenance and recommended screening tests for an annual wellness exam were reviewed in full today.  Brought up to date unless services declined.  Counselled on the importance of diet, exercise, and its role in overall health and mortality.  The patient's FH and SH was reviewed, including their home life, tobacco status, and drug and alcohol status.   Vaccines:Due for  Tdap No family history of  early prostate/colon cancer  Not interested in STD testing

## 2014-07-20 ENCOUNTER — Ambulatory Visit (INDEPENDENT_AMBULATORY_CARE_PROVIDER_SITE_OTHER): Payer: 59

## 2014-07-20 ENCOUNTER — Ambulatory Visit: Payer: 59 | Admitting: Podiatry

## 2014-07-20 VITALS — BP 122/76 | HR 83 | Resp 16

## 2014-07-20 DIAGNOSIS — M779 Enthesopathy, unspecified: Secondary | ICD-10-CM

## 2014-07-20 DIAGNOSIS — M79609 Pain in unspecified limb: Secondary | ICD-10-CM

## 2014-07-20 MED ORDER — TRIAMCINOLONE ACETONIDE 10 MG/ML IJ SUSP
10.0000 mg | Freq: Once | INTRAMUSCULAR | Status: AC
  Administered 2014-07-20: 10 mg

## 2014-07-20 MED ORDER — PREDNISONE 10 MG PO TABS: ORAL_TABLET | ORAL | Status: DC

## 2014-07-20 NOTE — Progress Notes (Signed)
Subjective:     Patient ID: Gregory Dorsey, male   DOB: 06-Apr-1966, 48 y.o.   MRN: [id number]  HPI patient presents stating I'm getting pain again in my right forefoot and it's been very bad for the last month. States that he does have to bend his foot a lot with the type of work that he.   Review of Systems     Objective:   Physical Exam Neurovascular status unchanged with significant discomfort second metatarsophalangeal joint right with fluid within the joint surface itself and no indications currently of digital movement    Assessment:     Inflammatory capsulitis second metatarsophalangeal joint right foot with no indications currently of flexor plate dislocation tear    Plan:     Reviewed x-rays and discussed with family this condition. We're going to take a very aggressive conservative approach to try to get this better and today I did a proximal nerve block I aspirated the joint was able to get out a clear fluid and injected with half cc of dexamethasone Kenalog. 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 Reappoint in 3 weeks to see results

## 2014-08-10 ENCOUNTER — Ambulatory Visit: Payer: 59 | Admitting: Podiatry

## 2015-05-07 ENCOUNTER — Encounter: Payer: No Typology Code available for payment source | Admitting: Family Medicine

## 2015-05-14 ENCOUNTER — Ambulatory Visit (INDEPENDENT_AMBULATORY_CARE_PROVIDER_SITE_OTHER): Payer: No Typology Code available for payment source | Admitting: Family Medicine

## 2015-05-14 ENCOUNTER — Telehealth: Payer: Self-pay

## 2015-05-14 ENCOUNTER — Encounter: Payer: Self-pay | Admitting: *Deleted

## 2015-05-14 ENCOUNTER — Encounter: Payer: Self-pay | Admitting: Family Medicine

## 2015-05-14 VITALS — BP 122/78 | HR 73 | Temp 97.6°F | Ht 71.5 in | Wt 214.5 lb

## 2015-05-14 DIAGNOSIS — Z0000 Encounter for general adult medical examination without abnormal findings: Secondary | ICD-10-CM | POA: Diagnosis not present

## 2015-05-14 DIAGNOSIS — R7309 Other abnormal glucose: Secondary | ICD-10-CM | POA: Diagnosis not present

## 2015-05-14 DIAGNOSIS — R7303 Prediabetes: Secondary | ICD-10-CM

## 2015-05-14 DIAGNOSIS — E781 Pure hyperglyceridemia: Secondary | ICD-10-CM | POA: Diagnosis not present

## 2015-05-14 DIAGNOSIS — E786 Lipoprotein deficiency: Secondary | ICD-10-CM

## 2015-05-14 DIAGNOSIS — N529 Male erectile dysfunction, unspecified: Secondary | ICD-10-CM

## 2015-05-14 DIAGNOSIS — R5383 Other fatigue: Secondary | ICD-10-CM | POA: Diagnosis not present

## 2015-05-14 LAB — COMPREHENSIVE METABOLIC PANEL
ALBUMIN: 4.4 g/dL (ref 3.5–5.2)
ALT: 19 U/L (ref 0–53)
AST: 18 U/L (ref 0–37)
Alkaline Phosphatase: 63 U/L (ref 39–117)
BILIRUBIN TOTAL: 0.9 mg/dL (ref 0.2–1.2)
BUN: 14 mg/dL (ref 6–23)
CALCIUM: 9.6 mg/dL (ref 8.4–10.5)
CO2: 29 mEq/L (ref 19–32)
CREATININE: 0.89 mg/dL (ref 0.40–1.50)
Chloride: 104 mEq/L (ref 96–112)
GFR: 96.58 mL/min (ref >60.00)
GLUCOSE: 86 mg/dL (ref 70–99)
Potassium: 4.1 mEq/L (ref 3.5–5.1)
Sodium: 139 mEq/L (ref 135–145)
Total Protein: 7.1 g/dL (ref 6.0–8.3)

## 2015-05-14 LAB — CBC WITH DIFFERENTIAL/PLATELET
BASOS ABS: 0 10*3/uL (ref 0.0–0.1)
Basophils Relative: 0.4 % (ref 0.0–3.0)
EOS PCT: 2.6 % (ref 0.0–5.0)
Eosinophils Absolute: 0.2 10*3/uL (ref 0.0–0.7)
HCT: 47.9 % (ref 39.0–52.0)
HEMOGLOBIN: 16.1 g/dL (ref 13.0–17.0)
LYMPHS PCT: 21.4 % (ref 12.0–46.0)
Lymphs Abs: 1.8 10*3/uL (ref 0.7–4.0)
MCHC: 33.6 g/dL (ref 30.0–36.0)
MCV: 87.6 fl (ref 78.0–100.0)
MONOS PCT: 8.5 % (ref 3.0–12.0)
Monocytes Absolute: 0.7 10*3/uL (ref 0.1–1.0)
NEUTROS ABS: 5.6 10*3/uL (ref 1.4–7.7)
NEUTROS PCT: 67.1 % (ref 43.0–77.0)
PLATELETS: 224 10*3/uL (ref 150.0–400.0)
RBC: 5.47 Mil/uL (ref 4.22–5.81)
RDW: 13.4 % (ref 11.5–15.5)
WBC: 8.3 10*3/uL (ref 4.0–10.5)

## 2015-05-14 LAB — LIPID PANEL
CHOLESTEROL: 208 mg/dL — AB (ref 0–200)
HDL: 60.4 mg/dL (ref >39.00)
LDL CALC: 120 mg/dL — AB (ref 0–99)
NONHDL: 147.6
TRIGLYCERIDES: 139 mg/dL (ref 0.0–149.0)
Total CHOL/HDL Ratio: 3
VLDL: 27.8 mg/dL (ref 0.0–40.0)

## 2015-05-14 LAB — HEMOGLOBIN A1C: Hgb A1c MFr Bld: 5.4 % (ref 4.6–6.5)

## 2015-05-14 LAB — VITAMIN B12: Vitamin B-12: 317 pg/mL (ref 211–911)

## 2015-05-14 LAB — TESTOSTERONE: Testosterone: 240.12 ng/dL — ABNORMAL LOW (ref 300.00–890.00)

## 2015-05-14 LAB — TSH: TSH: 0.69 u[IU]/mL (ref 0.35–4.50)

## 2015-05-14 MED ORDER — SILDENAFIL CITRATE 20 MG PO TABS: ORAL_TABLET | ORAL | Status: DC

## 2015-05-14 MED ORDER — TADALAFIL 20 MG PO TABS: ORAL_TABLET | ORAL | Status: DC

## 2015-05-14 NOTE — Assessment & Plan Note (Signed)
Due for re-eval. 

## 2015-05-14 NOTE — Assessment & Plan Note (Signed)
Eval with labs for etiology. 

## 2015-05-14 NOTE — Addendum Note (Signed)
Addended by: Daralene Milch C on: 05/14/2015 11:50 AM   Modules accepted: SmartSet

## 2015-05-14 NOTE — Progress Notes (Signed)
Pre visit review using our clinic review tool, if applicable. No additional management support is needed unless otherwise documented below in the visit note. 

## 2015-05-14 NOTE — Patient Instructions (Addendum)
Stop at lab on way out.  Work on healthy eating and regular cardiovascular exercise.

## 2015-05-14 NOTE — Progress Notes (Signed)
The patient is here for annual wellness exam and preventative care.   Prediabetes:  Due for re-eval.  Elevated BP in past, resolved.  BP Readings from Last 3 Encounters:  05/14/15 122/78  07/20/14 122/76  05/01/14 130/80   GERD: well controlled on no med. Avoids triggers.  Elevated Cholesterol: Due for re-eval. Lab Results  Component Value Date   CHOL 255* 05/01/2014   HDL 60.90 05/01/2014   LDLCALC 167* 05/01/2014   LDLDIRECT 117.7 01/19/2013   TRIG 135.0 05/01/2014   CHOLHDL 4 05/01/2014  Diet compliance: Moderate, limited fast food, limited water intake.  Exercise: very active at work and home   Body mass index is 29.5 kg/(m^2).    ED:  Harder to maintain an erection. Desire has decreased some in last year.  He has noted  less endurance than in past year.  No depression. PHQ2 neg.  Review of Systems  Constitutional: Negative for fever, fatigue and unexpected weight change.  HENT: Negative for ear pain, congestion, sore throat, rhinorrhea, trouble swallowing and postnasal drip.  Eyes: Negative for pain.  Respiratory: Negative for cough, shortness of breath and wheezing.  Cardiovascular: Negative for chest pain, palpitations and leg swelling.  Gastrointestinal: Negative for nausea, abdominal pain, diarrhea, constipation and blood in stool.  Genitourinary: Negative for dysuria, urgency, hematuria, discharge, penile swelling, scrotal swelling, difficulty urinating, penile pain and testicular pain.  Musculoskeletal: Positive for arthralgias.  Skin: Negative for rash.  Neurological: Negative for syncope, weakness, light-headedness, numbness and headaches.  Psychiatric/Behavioral: Negative for behavioral problems and dysphoric mood. The patient is not nervous/anxious.  Objective:   Physical Exam  Constitutional: He appears well-developed and well-nourished. Non-toxic appearance. He does not appear ill. No distress.  HENT:  Head: Normocephalic and atraumatic.   Right Ear: Hearing, tympanic membrane, external ear and ear canal normal.  Left Ear: Hearing, tympanic membrane, external ear and ear canal normal.  Nose: Nose normal.  Mouth/Throat: Uvula is midline, oropharynx is clear and moist and mucous membranes are normal.  Eyes: Conjunctivae, EOM and lids are normal. Pupils are equal, round, and reactive to light. No foreign bodies found.  Neck: Trachea normal, normal range of motion and phonation normal. Neck supple. Carotid bruit is not present. No mass and no thyromegaly present.  Cardiovascular: Normal rate, regular rhythm, S1 normal, S2 normal, intact distal pulses and normal pulses. Exam reveals no gallop.  No murmur heard.  Pulmonary/Chest: Breath sounds normal. He has no wheezes. He has no rhonchi. He has no rales.  Abdominal: Soft. Normal appearance and bowel sounds are normal. There is no hepatosplenomegaly. There is no tenderness. There is no rebound, no guarding and no CVA tenderness. No hernia. Hernia confirmed negative in the right inguinal area and confirmed negative in the left inguinal area.  Genitourinary: Testes normal and penis normal. Right testis shows no mass and no tenderness. Left testis shows no mass and no tenderness. No paraphimosis or penile tenderness.  Lymphadenopathy:  He has no cervical adenopathy.  Right: No inguinal adenopathy present.  Left: No inguinal adenopathy present.  Neurological: He is alert. He has normal strength and normal reflexes. No cranial nerve deficit or sensory deficit. Gait normal.  Skin: Skin is warm, dry and intact. No rash noted.  Psychiatric: He has a normal mood and affect. His speech is normal and behavior is normal. Judgment normal.  Assessment & Plan:   The patient's preventative maintenance and recommended screening tests for an annual wellness exam were reviewed in full today.  Brought  up to date unless services declined.  Counselled on the importance of diet, exercise, and  its role in overall health and mortality.  The patient's FH and SH was reviewed, including their home life, tobacco status, and drug and alcohol status.  Vaccines:uptodate Tdap No family history of early prostate/colon cancer  Not interested in STD testing  Nonsmoker

## 2015-05-14 NOTE — Telephone Encounter (Signed)
[place] with [facility] left v/m; pts copay for cialis is over $500.00; request substitute rx for sildenafil 20 mg ( # 50 of sildenafil 20 mg cost to pt is $80.00).Please advise.

## 2015-05-14 NOTE — Assessment & Plan Note (Signed)
NMl GU exam.  Trial of cialis.

## 2015-05-30 ENCOUNTER — Other Ambulatory Visit: Payer: Self-pay | Admitting: Family Medicine

## 2015-05-30 DIAGNOSIS — R7989 Other specified abnormal findings of blood chemistry: Secondary | ICD-10-CM

## 2015-06-07 ENCOUNTER — Other Ambulatory Visit (INDEPENDENT_AMBULATORY_CARE_PROVIDER_SITE_OTHER): Payer: No Typology Code available for payment source

## 2015-06-07 DIAGNOSIS — E291 Testicular hypofunction: Secondary | ICD-10-CM

## 2015-06-07 DIAGNOSIS — R7989 Other specified abnormal findings of blood chemistry: Secondary | ICD-10-CM

## 2015-06-07 LAB — TESTOSTERONE: Testosterone: 275.4 ng/dL — ABNORMAL LOW (ref 300.00–890.00)

## 2016-05-12 ENCOUNTER — Telehealth: Payer: Self-pay | Admitting: Family Medicine

## 2016-05-12 ENCOUNTER — Other Ambulatory Visit (INDEPENDENT_AMBULATORY_CARE_PROVIDER_SITE_OTHER): Payer: No Typology Code available for payment source

## 2016-05-12 DIAGNOSIS — R7303 Prediabetes: Secondary | ICD-10-CM

## 2016-05-12 DIAGNOSIS — E781 Pure hyperglyceridemia: Secondary | ICD-10-CM | POA: Diagnosis not present

## 2016-05-12 LAB — COMPREHENSIVE METABOLIC PANEL
ALT: 18 U/L (ref 0–53)
AST: 18 U/L (ref 0–37)
Albumin: 4.5 g/dL (ref 3.5–5.2)
Alkaline Phosphatase: 67 U/L (ref 39–117)
BUN: 15 mg/dL (ref 6–23)
CHLORIDE: 103 meq/L (ref 96–112)
CO2: 28 meq/L (ref 19–32)
Calcium: 9.7 mg/dL (ref 8.4–10.5)
Creatinine, Ser: 1.02 mg/dL (ref 0.40–1.50)
GFR: 82.18 mL/min (ref >60.00)
GLUCOSE: 92 mg/dL (ref 70–99)
POTASSIUM: 4.1 meq/L (ref 3.5–5.1)
SODIUM: 138 meq/L (ref 135–145)
Total Bilirubin: 0.9 mg/dL (ref 0.2–1.2)
Total Protein: 7.2 g/dL (ref 6.0–8.3)

## 2016-05-12 LAB — HEMOGLOBIN A1C: Hgb A1c MFr Bld: 5.4 % (ref 4.6–6.5)

## 2016-05-12 LAB — LIPID PANEL
CHOLESTEROL: 205 mg/dL — AB (ref 0–200)
HDL: 52.6 mg/dL (ref >39.00)
LDL CALC: 134 mg/dL — AB (ref 0–99)
NonHDL: 152.18
TRIGLYCERIDES: 90 mg/dL (ref 0.0–149.0)
Total CHOL/HDL Ratio: 4
VLDL: 18 mg/dL (ref 0.0–40.0)

## 2016-05-12 NOTE — Telephone Encounter (Signed)
-----   Message from Ellamae Sia sent at 05/06/2016 11:09 AM EDT ----- Regarding: Lab orders for Tuesday, 7.11.17 Patient is scheduled for CPX labs, please order future labs, Thanks , Karna Christmas

## 2016-05-19 ENCOUNTER — Encounter: Payer: No Typology Code available for payment source | Admitting: Family Medicine

## 2016-05-29 ENCOUNTER — Ambulatory Visit (INDEPENDENT_AMBULATORY_CARE_PROVIDER_SITE_OTHER): Payer: No Typology Code available for payment source | Admitting: Family Medicine

## 2016-05-29 ENCOUNTER — Encounter: Payer: Self-pay | Admitting: *Deleted

## 2016-05-29 ENCOUNTER — Encounter: Payer: Self-pay | Admitting: Family Medicine

## 2016-05-29 VITALS — BP 120/70 | HR 72 | Temp 98.2°F | Ht 71.5 in | Wt 209.0 lb

## 2016-05-29 DIAGNOSIS — Z1211 Encounter for screening for malignant neoplasm of colon: Secondary | ICD-10-CM

## 2016-05-29 DIAGNOSIS — K219 Gastro-esophageal reflux disease without esophagitis: Secondary | ICD-10-CM

## 2016-05-29 DIAGNOSIS — E7800 Pure hypercholesterolemia, unspecified: Secondary | ICD-10-CM | POA: Diagnosis not present

## 2016-05-29 DIAGNOSIS — Z125 Encounter for screening for malignant neoplasm of prostate: Secondary | ICD-10-CM

## 2016-05-29 LAB — PSA: PSA: 0.95 ng/mL (ref 0.10–4.00)

## 2016-05-29 NOTE — Progress Notes (Signed)
The patient is here for annual wellness exam and preventative care.   Prediabetes:   Resolved. Lab Results  Component Value Date   HGBA1C 5.4 05/12/2016   Elevated BP in past, resolved.  BP Readings from Last 3 Encounters:  05/29/16 120/70  05/14/15 122/78  07/20/14 122/76   GERD: well controlled on no med. Avoids triggers.  Elevated Cholesterol: LDL almost at goal < 130 on no med. Lab Results  Component Value Date   CHOL 205 (H) 05/12/2016   HDL 52.60 05/12/2016   LDLCALC 134 (H) 05/12/2016   LDLDIRECT 117.7 01/19/2013   TRIG 90.0 05/12/2016   CHOLHDL 4 05/12/2016   Diet compliance: Moderate, limited fast food, limited water intake.  Exercise: very active at work and home   Body mass index is 28.74 kg/m.   ED:  [employer] prn. Had SE to it.  Social History /Family History/Past Medical History reviewed and updated if needed.   Review of Systems  Constitutional: Negative for fever, fatigue and unexpected weight change.  HENT: Negative for ear pain, congestion, sore throat, rhinorrhea, trouble swallowing and postnasal drip. Eyes: Negative for pain.  Respiratory: Negative for cough, shortness of breath and wheezing.  Cardiovascular: Negative for chest pain, palpitations and leg swelling.  Gastrointestinal: Negative for nausea, abdominal pain, diarrhea, constipation and blood in stool.  Genitourinary: Negative for dysuria, urgency, hematuria, discharge, penile swelling, scrotal swelling, difficulty urinating, penile pain and testicular pain.  Musculoskeletal: Positive for arthralgias.  Skin: Negative for rash.  Neurological: Negative for syncope, weakness, light-headedness, numbness and headaches.  Psychiatric/Behavioral: Negative for behavioral problems and dysphoric mood. The patient is not nervous/anxious.  Objective:   Physical Exam  Constitutional: He appears well-developed and well-nourished. Non-toxic appearance. He does not appear ill. No distress.   HENT:  Head: Normocephalic and atraumatic.  Right Ear: Hearing, tympanic membrane, external ear and ear canal normal.  Left Ear: Hearing, tympanic membrane, external ear and ear canal normal.  Nose: Nose normal.  Mouth/Throat: Uvula is midline, oropharynx is clear and moist and mucous membranes are normal.  Eyes: Conjunctivae, EOM and lids are normal. Pupils are equal, round, and reactive to light. No foreign bodies found.  Neck: Trachea normal, normal range of motion and phonation normal. Neck supple. Carotid bruit is not present. No mass and no thyromegaly present.  Cardiovascular: Normal rate, regular rhythm, S1 normal, S2 normal, intact distal pulses and normal pulses. Exam reveals no gallop.  No murmur heard.  Pulmonary/Chest: Breath sounds normal. He has no wheezes. He has no rhonchi. He has no rales.  Abdominal: Soft. Normal appearance and bowel sounds are normal. There is no hepatosplenomegaly. There is no tenderness. There is no rebound, no guarding and no CVA tenderness. No hernia. Hernia confirmed negative in the right inguinal area and confirmed negative in the left inguinal area.  Genitourinary:  Exam not indicated. Lymphadenopathy:  He has no cervical adenopathy.  Right: No inguinal adenopathy present.  Left: No inguinal adenopathy present.  Neurological: He is alert. He has normal strength and normal reflexes. No cranial nerve deficit or sensory deficit. Gait normal.  Skin: Skin is warm, dry and intact. No rash noted.  Psychiatric: He has a normal mood and affect. His speech is normal and behavior is normal. Judgment normal.  Assessment & Plan:   The patient's preventative maintenance and recommended screening tests for an annual wellness exam were reviewed in full today.  Brought up to date unless services declined.  Counselled on the importance of diet, exercise,  and its role in overall health and mortality.  The patient's FH and SH was reviewed, including  their home life, tobacco status, and drug and alcohol status.  Vaccines:uptodate Tdap No family history of early prostate/colon cancer . Due for PSA testing.  Colon: due for colon cancer screen;  Not interested in STD testing  Non smoker

## 2016-05-29 NOTE — Assessment & Plan Note (Signed)
Stable control. 

## 2016-05-29 NOTE — Progress Notes (Signed)
Pre visit review using our clinic review tool, if applicable. No additional management support is needed unless otherwise documented below in the visit note. 

## 2016-05-29 NOTE — Assessment & Plan Note (Signed)
Pt eats a lot of bacon, eggs cheese burgers, etc. Reviewed diet in detail.Work on low cholesterol diet. Increase exercise. Follow up next year with re-eval.

## 2016-05-29 NOTE — Patient Instructions (Addendum)
Stop at lab on way out for prostate cancer testing.  Work on low cholesterol diet, review information given.  Stop  at front desk on way out to schedule colonoscopy.

## 2016-07-03 ENCOUNTER — Encounter: Payer: Self-pay | Admitting: Family Medicine

## 2016-07-30 ENCOUNTER — Telehealth: Payer: Self-pay | Admitting: Family Medicine

## 2016-07-30 NOTE — Telephone Encounter (Signed)
Patient Name: Gregory Dorsey  DOB: 05/27/1956    Initial Comment Caller says, thinks he may have broken or dislocated his thumb yesterday -- wants to k now if he needs to come here, go to an UC, or ER.    Nurse Assessment  Nurse: Raphael Gibney, RN, Vanita Ingles Date/Time (Eastern Time): 07/30/2016 10:28:59 AM  Confirm and document reason for call. If symptomatic, describe symptoms. You must click the next button to save text entered. ---Caller states he injured his right thumb yesterday. Has a slight amount of swelling. He is unable to make a fist. Hurts to move his thumb.  Has the patient traveled out of the country within the last 30 days? ---Not Applicable  Does the patient have any new or worsening symptoms? ---Yes  Will a triage be completed? ---Yes  Related visit to physician within the last 2 weeks? ---No  Does the PT have any chronic conditions? (i.e. diabetes, asthma, etc.) ---No  Is this a behavioral health or substance abuse call? ---No     Guidelines    Guideline Title Affirmed Question Affirmed Notes  Finger Injury Finger joint can't be opened (straightened) or closed (bent) completely (Note: injured person should be able to do this without assistance)    Final Disposition User   See Physician within 24 Hours Stringer, RN, Vanita Ingles    Comments  Pt does not want to come to the office but wants to go to urgent care.   Referrals  [facility]   Disagree/Comply: Comply

## 2016-07-30 NOTE — Telephone Encounter (Signed)
Spoke with pt and he is very busy and has not gone to UC yet but plans to go either tonight or in AM. Advised pt if wants appt at [facility] to let us know. Pt was appreciative.

## 2016-08-11 ENCOUNTER — Ambulatory Visit (INDEPENDENT_AMBULATORY_CARE_PROVIDER_SITE_OTHER): Payer: 59 | Admitting: Orthopaedic Surgery

## 2016-08-11 DIAGNOSIS — M1811 Unilateral primary osteoarthritis of first carpometacarpal joint, right hand: Secondary | ICD-10-CM

## 2016-08-25 ENCOUNTER — Ambulatory Visit (INDEPENDENT_AMBULATORY_CARE_PROVIDER_SITE_OTHER): Payer: 59 | Admitting: Orthopaedic Surgery

## 2017-04-08 ENCOUNTER — Encounter: Payer: Self-pay | Admitting: Family Medicine

## 2017-05-28 ENCOUNTER — Other Ambulatory Visit: Payer: No Typology Code available for payment source

## 2017-06-01 ENCOUNTER — Encounter: Payer: No Typology Code available for payment source | Admitting: Family Medicine

## 2017-07-14 ENCOUNTER — Telehealth: Payer: Self-pay | Admitting: Family Medicine

## 2017-07-14 DIAGNOSIS — E7800 Pure hypercholesterolemia, unspecified: Secondary | ICD-10-CM

## 2017-07-14 DIAGNOSIS — Z125 Encounter for screening for malignant neoplasm of prostate: Secondary | ICD-10-CM

## 2017-07-14 NOTE — Telephone Encounter (Signed)
-----   Message from Ellamae Sia sent at 07/09/2017 11:42 AM EDT ----- Regarding: Lab orders for Thursday, 9.13.18 Patient is scheduled for CPX labs, please order future labs, Thanks , Karna Christmas

## 2017-07-15 ENCOUNTER — Other Ambulatory Visit (INDEPENDENT_AMBULATORY_CARE_PROVIDER_SITE_OTHER): Payer: No Typology Code available for payment source

## 2017-07-15 DIAGNOSIS — Z125 Encounter for screening for malignant neoplasm of prostate: Secondary | ICD-10-CM | POA: Diagnosis not present

## 2017-07-15 DIAGNOSIS — E7800 Pure hypercholesterolemia, unspecified: Secondary | ICD-10-CM

## 2017-07-15 LAB — COMPREHENSIVE METABOLIC PANEL
ALT: 18 U/L (ref 0–53)
AST: 16 U/L (ref 0–37)
Albumin: 4.4 g/dL (ref 3.5–5.2)
Alkaline Phosphatase: 63 U/L (ref 39–117)
BILIRUBIN TOTAL: 0.6 mg/dL (ref 0.2–1.2)
BUN: 14 mg/dL (ref 6–23)
CO2: 26 meq/L (ref 19–32)
CREATININE: 0.9 mg/dL (ref 0.40–1.50)
Calcium: 9.4 mg/dL (ref 8.4–10.5)
Chloride: 105 mEq/L (ref 96–112)
GFR: 94.5 mL/min (ref >60.00)
GLUCOSE: 89 mg/dL (ref 70–99)
POTASSIUM: 4.3 meq/L (ref 3.5–5.1)
SODIUM: 139 meq/L (ref 135–145)
TOTAL PROTEIN: 6.6 g/dL (ref 6.0–8.3)

## 2017-07-15 LAB — LIPID PANEL
CHOL/HDL RATIO: 3
Cholesterol: 201 mg/dL — ABNORMAL HIGH (ref 0–200)
HDL: 65.4 mg/dL (ref >39.00)
LDL Cholesterol: 106 mg/dL — ABNORMAL HIGH (ref 0–99)
NONHDL: 135.41
Triglycerides: 147 mg/dL (ref 0.0–149.0)
VLDL: 29.4 mg/dL (ref 0.0–40.0)

## 2017-07-15 LAB — PSA: PSA: 0.78 ng/mL (ref 0.10–4.00)

## 2017-07-20 ENCOUNTER — Encounter: Payer: Self-pay | Admitting: Family Medicine

## 2017-07-20 ENCOUNTER — Ambulatory Visit (INDEPENDENT_AMBULATORY_CARE_PROVIDER_SITE_OTHER): Payer: No Typology Code available for payment source | Admitting: Family Medicine

## 2017-07-20 VITALS — BP 118/70 | HR 80 | Temp 98.2°F | Ht 71.5 in | Wt 212.5 lb

## 2017-07-20 DIAGNOSIS — Z1211 Encounter for screening for malignant neoplasm of colon: Secondary | ICD-10-CM

## 2017-07-20 DIAGNOSIS — Z1212 Encounter for screening for malignant neoplasm of rectum: Secondary | ICD-10-CM

## 2017-07-20 DIAGNOSIS — Z0000 Encounter for general adult medical examination without abnormal findings: Secondary | ICD-10-CM

## 2017-07-20 DIAGNOSIS — J3489 Other specified disorders of nose and nasal sinuses: Secondary | ICD-10-CM | POA: Diagnosis not present

## 2017-07-20 DIAGNOSIS — E7800 Pure hypercholesterolemia, unspecified: Secondary | ICD-10-CM

## 2017-07-20 MED ORDER — MUPIROCIN 2 % EX OINT: 1.0000 "application " | TOPICAL_OINTMENT | Freq: Two times a day (BID) | CUTANEOUS | 0 refills | Status: AC

## 2017-07-20 NOTE — Assessment & Plan Note (Signed)
At goal.. Improved from last year with lifestyle.  HDL > 60! Encouraged exercise, weight loss, healthy eating habits.

## 2017-07-20 NOTE — Assessment & Plan Note (Signed)
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 Treat with mupirocin ointment  5 days. 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  Advised to wear mask.

## 2017-07-20 NOTE — Progress Notes (Signed)
Subjective:    Patient ID: Gregory Dorsey, male    DOB: 1966/08/10, 51 y.o.   MRN: [id number]  HPI  The patient is here for annual wellness exam and preventative care.    Has noted off and on sores in nose in last few years.Has note treated with anything. picks areas a lot. Bleeding and scabbing off and on.  No oral ulcers.  No personal of family history of recurrent boils. No history of fever blister.   BP Readings from Last 3 Encounters:  07/20/17 118/70  05/29/16 120/70  05/14/15 122/78  Exercise:  None, very active job  Diet: moderate diet. Wt Readings from Last 3 Encounters:  07/20/17 212 lb 8 oz (96.4 kg)  05/29/16 209 lb (94.8 kg)  05/14/15 214 lb 8 oz (97.3 kg)   Body mass index is 29.22 kg/m.  Elevated Cholesterol:  Improved with lifestyle Lab Results  Component Value Date   CHOL 201 (H) 07/15/2017   HDL 65.40 07/15/2017   LDLCALC 106 (H) 07/15/2017   LDLDIRECT 117.7 01/19/2013   TRIG 147.0 07/15/2017   CHOLHDL 3 07/15/2017      Social History /Family History/Past Medical History reviewed in detail and updated in EMR if needed. Blood pressure 118/70, pulse 80, temperature 98.2 F (36.8 C), temperature source Oral, height 5' 11.5" (1.816 m), weight 212 lb 8 oz (96.4 kg).    Review of Systems  Constitutional: Negative for fatigue and fever.  HENT: Negative for ear pain.   Eyes: Negative for pain.  Respiratory: Negative for cough and shortness of breath.   Cardiovascular: Negative for chest pain, palpitations and leg swelling.  Gastrointestinal: Negative for abdominal pain.  Genitourinary: Negative for dysuria.  Musculoskeletal: Negative for arthralgias.  Neurological: Negative for syncope, light-headedness and headaches.  Psychiatric/Behavioral: Negative for dysphoric mood.       Objective:   Physical Exam  Constitutional: He appears well-developed and well-nourished.  Non-toxic appearance. He does not appear ill. No distress.  HENT:  Head:  Normocephalic and atraumatic.  Right Ear: Hearing, tympanic membrane, external ear and ear canal normal.  Left Ear: Hearing, tympanic membrane, external ear and ear canal normal.  Nose: Nose normal. No mucosal edema.  Mouth/Throat: Uvula is midline, oropharynx is clear and moist and mucous membranes are normal.   Scab in left nostril, no underlying abscess.  Eyes: Pupils are equal, round, and reactive to light. Conjunctivae, EOM and lids are normal. Lids are everted and swept, no foreign bodies found.  Neck: Trachea normal, normal range of motion and phonation normal. Neck supple. Carotid bruit is not present. No thyroid mass and no thyromegaly present.  Cardiovascular: Normal rate, regular rhythm, S1 normal, S2 normal, intact distal pulses and normal pulses.  Exam reveals no gallop.   No murmur heard. Pulmonary/Chest: Breath sounds normal. He has no wheezes. He has no rhonchi. He has no rales.  Abdominal: Soft. Normal appearance and bowel sounds are normal. There is no hepatosplenomegaly. There is no tenderness. There is no rebound, no guarding and no CVA tenderness. No hernia.  Lymphadenopathy:    He has no cervical adenopathy.  Neurological: He is alert. He has normal strength and normal reflexes. No cranial nerve deficit or sensory deficit. Gait normal.  Skin: Skin is warm, dry and intact. No rash noted.  Psychiatric: He has a normal mood and affect. His speech is normal and behavior is normal. Judgment normal.          Assessment & Plan:  The  patient's preventative maintenance and recommended screening tests for an annual wellness exam were reviewed in full today. Brought up to date unless services declined.  Counselled on the importance of diet, exercise, and its role in overall health and mortality. The patient's FH and SH was reviewed, including their home life, tobacco status, and drug and alcohol status.   Vaccines:uptodate Tdap, not interested in flu vaccine No family  history of early prostate/colon cancer . Lab Results  Component Value Date   PSA 0.78 07/15/2017   PSA 0.95 05/29/2016   Colon: due for colon cancer screen. Not interested in STD testing Non smoker.  no ETOH use.

## 2017-07-20 NOTE — Patient Instructions (Addendum)
Work on low cholesterol diet and healthy eating habits.  Continue exercise.  Please stop at the lab to pick up stool colon cancer screening test.  Start  Topical antibiotic ointment for nose.

## 2018-05-04 ENCOUNTER — Ambulatory Visit (INDEPENDENT_AMBULATORY_CARE_PROVIDER_SITE_OTHER): Payer: 59

## 2018-05-04 ENCOUNTER — Encounter: Payer: Self-pay | Admitting: Podiatry

## 2018-05-04 ENCOUNTER — Ambulatory Visit (INDEPENDENT_AMBULATORY_CARE_PROVIDER_SITE_OTHER): Payer: 59 | Admitting: Podiatry

## 2018-05-04 VITALS — BP 135/73 | HR 74

## 2018-05-04 DIAGNOSIS — M79672 Pain in left foot: Secondary | ICD-10-CM

## 2018-05-04 DIAGNOSIS — M899 Disorder of bone, unspecified: Secondary | ICD-10-CM | POA: Diagnosis not present

## 2018-05-04 DIAGNOSIS — Q828 Other specified congenital malformations of skin: Secondary | ICD-10-CM

## 2018-05-04 MED ORDER — IBUPROFEN 600 MG PO TABS: 600.0000 mg | ORAL_TABLET | Freq: Three times a day (TID) | ORAL | 0 refills | Status: AC | PRN

## 2018-05-05 ENCOUNTER — Encounter: Payer: Self-pay | Admitting: Podiatry

## 2018-05-07 NOTE — Progress Notes (Addendum)
Subjective: Gregory Dorsey is seen today with cc of painful "lesions" on the side of his left foot. He has been treated for this by Dr. Paulla Dolly in the past with success.  He also has secondary complaint of pain in the left foot for the past year and points to the bottom of his 1st metatarsal head area. He states he wears workboots about 14 hours/day on concrete floors. He states he gets a new pair of workboots every year and sometimes has his old ones resoled, so he feels he always has adequate shoegear in that aspect.  Objective: Vitals: BP 135/73 Pulse 74 bpm  Neurovascular status intact  Dermatological: Punctate hyperkeratotic lesions noted lateral aspect of left 5th metatarsal head x 2  Musculoskeletal: Pain noted on palpation to tibial sesamoid left foot  Xrays: Tibial sesamoid position of 3.   Assessment: 1. 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 Tibial Sesamoiditis left foot 3. Pain in left foot  Plan: 1. Discussed diagnoses and treatment options  2. Pt agreed and desired paring of porokeratotic lesions left 5th metatarsal head. Lesions debrided left 5th metatarsal head x 2. 3. Padded left 5th metatarsal head area with moleskin. Advised pt to apply moleskin to area daily and remove every evening. 4. Xrays performed left foot 3 views 5.Recommended removable metatarsal padding for tibial sesamoiditis and given retail information for purchasing pads for both feet 6. If padding helps symptoms, may check orthotic benefits for Gregory Dorsey to wear in his workboots. 7. Rx for Ibuprofen 600 mg po every 8 hours after meals x 14 days.

## 2018-05-11 ENCOUNTER — Ambulatory Visit: Payer: 59 | Admitting: Podiatry

## 2018-07-15 ENCOUNTER — Telehealth: Payer: Self-pay | Admitting: Family Medicine

## 2018-07-15 ENCOUNTER — Other Ambulatory Visit (INDEPENDENT_AMBULATORY_CARE_PROVIDER_SITE_OTHER): Payer: 59

## 2018-07-15 DIAGNOSIS — Z125 Encounter for screening for malignant neoplasm of prostate: Secondary | ICD-10-CM | POA: Diagnosis not present

## 2018-07-15 DIAGNOSIS — E7800 Pure hypercholesterolemia, unspecified: Secondary | ICD-10-CM

## 2018-07-15 LAB — LIPID PANEL
CHOL/HDL RATIO: 4
Cholesterol: 198 mg/dL (ref 0–200)
HDL: 50.7 mg/dL (ref >39.00)
LDL CALC: 131 mg/dL — AB (ref 0–99)
NONHDL: 147.12
Triglycerides: 79 mg/dL (ref 0.0–149.0)
VLDL: 15.8 mg/dL (ref 0.0–40.0)

## 2018-07-15 LAB — COMPREHENSIVE METABOLIC PANEL
ALK PHOS: 61 U/L (ref 39–117)
ALT: 17 U/L (ref 0–53)
AST: 14 U/L (ref 0–37)
Albumin: 4.6 g/dL (ref 3.5–5.2)
BUN: 16 mg/dL (ref 6–23)
CO2: 24 meq/L (ref 19–32)
Calcium: 9.6 mg/dL (ref 8.4–10.5)
Chloride: 106 mEq/L (ref 96–112)
Creatinine, Ser: 0.96 mg/dL (ref 0.40–1.50)
GFR: 87.38 mL/min (ref >60.00)
GLUCOSE: 88 mg/dL (ref 70–99)
POTASSIUM: 4.2 meq/L (ref 3.5–5.1)
SODIUM: 140 meq/L (ref 135–145)
TOTAL PROTEIN: 6.9 g/dL (ref 6.0–8.3)
Total Bilirubin: 0.6 mg/dL (ref 0.2–1.2)

## 2018-07-15 LAB — PSA: PSA: 0.78 ng/mL (ref 0.10–4.00)

## 2018-07-15 NOTE — Telephone Encounter (Signed)
-----   Message from Ellamae Sia sent at 07/15/2018  7:39 AM EDT ----- Regarding: lab orders for now Patient is scheduled for CPX labs, please order future labs, Thanks , Terri  patient walked in

## 2018-07-22 ENCOUNTER — Ambulatory Visit (INDEPENDENT_AMBULATORY_CARE_PROVIDER_SITE_OTHER): Payer: 59 | Admitting: Family Medicine

## 2018-07-22 ENCOUNTER — Encounter: Payer: Self-pay | Admitting: Family Medicine

## 2018-07-22 VITALS — BP 120/80 | HR 73 | Temp 97.7°F | Ht 71.25 in | Wt 210.5 lb

## 2018-07-22 DIAGNOSIS — E7800 Pure hypercholesterolemia, unspecified: Secondary | ICD-10-CM

## 2018-07-22 DIAGNOSIS — Z8249 Family history of ischemic heart disease and other diseases of the circulatory system: Secondary | ICD-10-CM | POA: Diagnosis not present

## 2018-07-22 DIAGNOSIS — Z0000 Encounter for general adult medical examination without abnormal findings: Secondary | ICD-10-CM

## 2018-07-22 DIAGNOSIS — Z1211 Encounter for screening for malignant neoplasm of colon: Secondary | ICD-10-CM

## 2018-07-22 NOTE — Patient Instructions (Addendum)
Please stop at the front desk to set up referral.  Work on increased exercise and low cholesterol diet.

## 2018-07-22 NOTE — Assessment & Plan Note (Signed)
AHA 10 year CVD risk 3.7% risk. Pt with early family history of CVD.Marland Kitchen discussed statin start he wishes to hold off. Diucssed low chol diet.

## 2018-07-22 NOTE — Progress Notes (Signed)
   Subjective:    Patient ID: Gregory Dorsey, male    DOB: 1966-06-07, 52 y.o.   MRN: [id number]  HPI The patient is here for annual wellness exam and preventative care.    Elevated Cholesterol:  Lab Results  Component Value Date   CHOL 198 07/15/2018   HDL 50.70 07/15/2018   LDLCALC 131 (H) 07/15/2018   LDLDIRECT 117.7 01/19/2013   TRIG 79.0 07/15/2018   CHOLHDL 4 07/15/2018  Using medications without problems: Muscle aches:  Diet compliance: moderate Exercise: at work he is very physical, occ at [employer] Other complaints:  No ETOH  Social History /Family History/Past Medical History reviewed in detail and updated in EMR if needed. Vitals:   07/22/18 0836  BP: 120/80  Pulse: 73  Temp: 97.7 F (36.5 C)   Body mass index is 29.15 kg/m.  Review of Systems  Constitutional: Negative for fatigue and fever.  HENT: Negative for ear pain.   Eyes: Negative for pain.  Respiratory: Negative for cough and shortness of breath.   Cardiovascular: Negative for chest pain, palpitations and leg swelling.  Gastrointestinal: Negative for abdominal pain.  Genitourinary: Negative for dysuria.  Musculoskeletal: Negative for arthralgias.  Neurological: Negative for syncope, light-headedness and headaches.  Psychiatric/Behavioral: Negative for dysphoric mood.       Objective:   Physical Exam  Constitutional: Vital signs are normal. He appears well-developed and well-nourished.  HENT:  Head: Normocephalic.  Right Ear: Hearing normal.  Left Ear: Hearing normal.  Nose: Nose normal.  Mouth/Throat: Oropharynx is clear and moist and mucous membranes are normal.  Neck: Trachea normal. Carotid bruit is not present. No thyroid mass and no thyromegaly present.  Cardiovascular: Normal rate, regular rhythm and normal pulses. Exam reveals no gallop, no distant heart sounds and no friction rub.  No murmur heard. No peripheral edema  Pulmonary/Chest: Effort normal and breath sounds normal. No  respiratory distress.  Skin: Skin is warm, dry and intact. No rash noted.  Psychiatric: He has a normal mood and affect. His speech is normal and behavior is normal. Thought content normal.          Assessment & Plan:  The patient's preventative maintenance and recommended screening tests for an annual wellness exam were reviewed in full today. Brought up to date unless services declined.  Counselled on the importance of diet, exercise, and its role in overall health and mortality. The patient's FH and SH was reviewed, including their home life, tobacco status, and drug and alcohol status.   Vaccines:uptodate Tdap, not interested in flu vaccine No family history of early prostate/colon cancer . Lab Results  Component Value Date   PSA 0.78 07/15/2018   PSA 0.78 07/15/2017   PSA 0.95 05/29/2016  Colon: due for colon cancer screen. Not interested in STD testing Non smoker.  No ETOH use.

## 2018-08-01 ENCOUNTER — Encounter: Payer: Self-pay | Admitting: Gastroenterology

## 2018-08-26 ENCOUNTER — Telehealth: Payer: Self-pay | Admitting: *Deleted

## 2018-08-26 NOTE — Telephone Encounter (Signed)
No call back, procedure and previsit cancelled.

## 2018-08-26 NOTE — Telephone Encounter (Signed)
Patient no showed for 1430 Previsit appointment. Message eft for the patient to call and reschedule. Patient informed if no response by 1700 , the colonoscopy will be rescheduled.

## 2018-09-09 ENCOUNTER — Encounter: Payer: 59 | Admitting: Gastroenterology

## 2018-09-26 ENCOUNTER — Encounter: Payer: Self-pay | Admitting: Gastroenterology

## 2018-09-26 ENCOUNTER — Ambulatory Visit (AMBULATORY_SURGERY_CENTER): Payer: Self-pay | Admitting: *Deleted

## 2018-09-26 VITALS — Ht 71.5 in | Wt 225.0 lb

## 2018-09-26 DIAGNOSIS — Z1211 Encounter for screening for malignant neoplasm of colon: Secondary | ICD-10-CM

## 2018-09-26 MED ORDER — NA SULFATE-K SULFATE-MG SULF 17.5-3.13-1.6 GM/177ML PO SOLN: ORAL | 0 refills | Status: DC

## 2018-09-26 NOTE — Progress Notes (Signed)
Patient denies any allergies to eggs or soy. Patient denies any problems with anesthesia/sedation. Patient denies any oxygen use at home. Patient denies taking any diet/weight loss medications or blood thinners. EMMI education offered, pt declined. Colonoscopy handout given to pt. Suprep $15 off coupon given to pt.

## 2018-10-10 ENCOUNTER — Encounter: Payer: Self-pay | Admitting: Gastroenterology

## 2018-10-10 ENCOUNTER — Ambulatory Visit (AMBULATORY_SURGERY_CENTER): Payer: 59 | Admitting: Gastroenterology

## 2018-10-10 VITALS — BP 109/68 | HR 75 | Temp 97.8°F | Resp 11 | Ht 71.5 in | Wt 225.0 lb

## 2018-10-10 DIAGNOSIS — K635 Polyp of colon: Secondary | ICD-10-CM

## 2018-10-10 DIAGNOSIS — D125 Benign neoplasm of sigmoid colon: Secondary | ICD-10-CM

## 2018-10-10 DIAGNOSIS — Z1211 Encounter for screening for malignant neoplasm of colon: Secondary | ICD-10-CM

## 2018-10-10 DIAGNOSIS — Z8371 Family history of colonic polyps: Secondary | ICD-10-CM

## 2018-10-10 MED ORDER — SODIUM CHLORIDE 0.9 % IV SOLN: 500.0000 mL | Freq: Once | INTRAVENOUS | Status: DC

## 2018-10-10 NOTE — Progress Notes (Signed)
Called to room to assist during endoscopic procedure.  Patient ID and intended procedure confirmed with present staff. Received instructions for my participation in the procedure from the performing physician.  

## 2018-10-10 NOTE — Patient Instructions (Signed)
Discharge instructions given. Handouts on polyps and Diverticulosis. Resume previous medications. YOU HAD AN ENDOSCOPIC PROCEDURE TODAY AT [facility]:   Refer to the procedure report that was given to you for any specific questions about what was found during the examination.  If the procedure report does not answer your questions, please call your gastroenterologist to clarify.  If you requested that your care partner not be given the details of your procedure findings, then the procedure report has been included in a sealed envelope for you to review at your convenience later.  YOU SHOULD EXPECT: Some feelings of bloating in the abdomen. Passage of more gas than usual.  Walking can help get rid of the air that was put into your GI tract during the procedure and reduce the bloating. If you had a lower endoscopy (such as a colonoscopy or flexible sigmoidoscopy) you may notice spotting of blood in your stool or on the toilet paper. If you underwent a bowel prep for your procedure, you may not have a normal bowel movement for a few days.  Please Note:  You might notice some irritation and congestion in your nose or some drainage.  This is from the oxygen used during your procedure.  There is no need for concern and it should clear up in a day or so.  SYMPTOMS TO REPORT IMMEDIATELY:   Following lower endoscopy (colonoscopy or flexible sigmoidoscopy):  Excessive amounts of blood in the stool  Significant tenderness or worsening of abdominal pains  Swelling of the abdomen that is new, acute  Fever of 100F or higher   For urgent or emergent issues, a gastroenterologist can be reached at any hour by calling [phone].   DIET:  We do recommend a small meal at first, but then you may proceed to your regular diet.  Drink plenty of fluids but you should avoid alcoholic beverages for 24 hours.  ACTIVITY:  You should plan to take it easy for the rest of today and you should NOT  DRIVE or use heavy machinery until tomorrow (because of the sedation medicines used during the test).    FOLLOW UP: Our staff will call the number listed on your records the next business day following your procedure to check on you and address any questions or concerns that you may have regarding the information given to you following your procedure. If we do not reach you, we will leave a message.  However, if you are feeling well and you are not experiencing any problems, there is no need to return our call.  We will assume that you have returned to your regular daily activities without incident.  If any biopsies were taken you will be contacted by phone or by letter within the next 1-3 weeks.  Please call us at [phone] if you have not heard about the biopsies in 3 weeks.    SIGNATURES/CONFIDENTIALITY: You and/or your care partner have signed paperwork which will be entered into your electronic medical record.  These signatures attest to the fact that that the information above on your After Visit Summary has been reviewed and is understood.  Full responsibility of the confidentiality of this discharge information lies with you and/or your care-partner.

## 2018-10-10 NOTE — Progress Notes (Signed)
Report given to PACU, vss 

## 2018-10-10 NOTE — Op Note (Signed)
[name] Procedure Date: 10/10/2018 10:07 AM MRN: [id number] Endoscopist: Jackquline Denmark , MD Age: 52 Referring MD:  Date of Birth: Oct 17, 1966 Gender: Male Account #: [id number] Procedure:                Colonoscopy Indications:              Colon cancer screening in patient at increased                            risk: Family history of 1st-degree relative with                            colon polyps Medicines:                Monitored Anesthesia Care Procedure:                Pre-Anesthesia Assessment:                           - Prior to the procedure, a History and Physical                            was performed, and patient medications and                            allergies were reviewed. The patient's tolerance of                            previous anesthesia was also reviewed. The risks                            and benefits of the procedure and the sedation                            options and risks were discussed with the patient.                            All questions were answered, and informed consent                            was obtained. Prior Anticoagulants: The patient has                            taken no previous anticoagulant or antiplatelet                            agents. ASA Grade Assessment: I - A normal, healthy                            patient. After reviewing the risks and benefits,                            the patient was deemed in satisfactory condition to  undergo the procedure.                           After obtaining informed consent, the colonoscope                            was passed under direct vision. Throughout the                            procedure, the patient's blood pressure, pulse, and                            oxygen saturations were monitored continuously. The                            Colonoscope was introduced through the anus and           advanced to the 2 cm into the ileum. The                            colonoscopy was performed without difficulty. The                            patient tolerated the procedure well. The quality                            of the bowel preparation was good. Scope In: 10:11:32 AM Scope Out: 10:25:50 AM Scope Withdrawal Time: 0 hours 7 minutes 42 seconds  Total Procedure Duration: 0 hours 14 minutes 18 seconds  Findings:                 A 2 mm polyp was found in the sigmoid colon. The                            polyp was sessile. The polyp was removed with a                            cold biopsy forceps. Resection and retrieval were                            complete. Estimated blood loss: none.                           A few small-mouthed diverticula were found in the                            sigmoid colon.                           The exam was otherwise without abnormality on                            direct and retroflexion views. Complications:            No immediate complications. Estimated Blood Loss:     Estimated blood loss: none.  Impression:               -Diminutive colonic polyp status post polypectomy.                           -Mild sigmoid diverticulosis.                           -The examination was otherwise normal on direct and                            retroflexion views. Recommendation:           - Patient has a contact number available for                            emergencies. The signs and symptoms of potential                            delayed complications were discussed with the                            patient. Return to normal activities tomorrow.                            Written discharge instructions were provided to the                            patient.                           - Resume previous diet.                           - Continue present medications.                           - Await pathology results.                            - Repeat colonoscopy in 5 years (d/t FH of colonic                            polyps) for surveillance.                           - Return to GI office PRN. Jackquline Denmark, MD 10/10/2018 10:34:52 AM This report has been signed electronically.

## 2018-10-11 ENCOUNTER — Telehealth: Payer: Self-pay

## 2018-10-11 NOTE — Telephone Encounter (Signed)
  Follow up Call-  Call back number 10/10/2018  Post procedure Call Back phone  # [phone]  Permission to leave phone message Yes  Some recent data might be hidden     Patient questions:  Do you have a fever, pain , or abdominal swelling? No. Pain Score  0 *  Have you tolerated food without any problems? Yes.    Have you been able to return to your normal activities? Yes.    Do you have any questions about your discharge instructions: Diet   No. Medications  No. Follow up visit  No.  Do you have questions or concerns about your Care? No.  Actions: * If pain score is 4 or above: No action needed, pain <4.

## 2018-10-14 ENCOUNTER — Telehealth: Payer: Self-pay | Admitting: Family Medicine

## 2018-10-14 NOTE — Telephone Encounter (Signed)
Left message asking pt to call office due to schedule change please r/s 08/01/19 appointment with dr Diona Browner.  Can use later time that day or what ever works best for pt

## 2018-10-19 ENCOUNTER — Encounter: Payer: Self-pay | Admitting: Gastroenterology

## 2019-04-17 ENCOUNTER — Other Ambulatory Visit: Payer: Self-pay

## 2019-04-17 ENCOUNTER — Encounter: Payer: Self-pay | Admitting: Family Medicine

## 2019-04-17 ENCOUNTER — Ambulatory Visit (INDEPENDENT_AMBULATORY_CARE_PROVIDER_SITE_OTHER): Payer: 59 | Admitting: Family Medicine

## 2019-04-17 ENCOUNTER — Ambulatory Visit (INDEPENDENT_AMBULATORY_CARE_PROVIDER_SITE_OTHER): Payer: 59

## 2019-04-17 DIAGNOSIS — M25512 Pain in left shoulder: Secondary | ICD-10-CM | POA: Diagnosis not present

## 2019-04-17 DIAGNOSIS — M25511 Pain in right shoulder: Secondary | ICD-10-CM

## 2019-04-17 DIAGNOSIS — G8929 Other chronic pain: Secondary | ICD-10-CM

## 2019-04-17 NOTE — Progress Notes (Signed)
Office Visit Note   Patient: Gregory Dorsey           Date of Birth: [birth year]           MRN: [id number] Visit Date: 04/17/2019 Requested by: Gregory Sanders, MD [place],  [place] [address] PCP: Gregory Sanders, MD  Subjective: Chief Complaint  Patient presents with  . Right Shoulder - Pain    DOI 04/12/2019 [place] last week - pain and decreased ROM in the shoulder since then. Unsure if he fell directly on it.  . Left Shoulder - Pain    Pain in the posterior shoulder, around the scapula, x years. "Not been able to do push ups in years." ROM ok in the left shoulder.    HPI: He is here with bilateral shoulder pain.  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  Immediate severe pain.  Unable to actively raise his arm overhead since then.  He has had chronic left shoulder pain for years.  He is unable to do a push-up.  Pain seems to be most intense in the posterior aspect.  He still has good range of motion.  Denies any neck pain, denies any radicular symptoms.                ROS: No fevers or chills.  All other systems were reviewed and are negative.  Objective: Vital Signs: There were no vitals taken for this visit.  Physical Exam:  General:  Alert and oriented, in no acute distress. Pulm:  Breathing unlabored. Psy:  Normal mood, congruent affect. Skin: No rash or bruising, no skin abrasion. Right shoulder: No visible deformity compared to the left.  He has prominent AC joints.  There is no tenderness to palpation over the right AC joint.  He is very tender in the posterior subacromial space and the lateral subacromial space.  It does not feel like his shoulder is dislocated by palpation.  Limited active and passive abduction.  He has pain and slight weakness with external rotation against resistance, no pain and good strength with internal rotation. Left shoulder: Full active  range of motion, 5/5 rotator cuff strength throughout.  There is slight tenderness in the posterior glenohumeral area and several trigger points in the rhomboid area but they do not completely reproduce his pain.    Imaging: X-rays right shoulder: No obvious fracture or dislocation.  He has early degenerative spurring in the glenohumeral joint and moderate AC joint arthropathy with prominent distal clavicle.  Musculoskeletal ultrasound right shoulder: Long head biceps tendon is in its groove and is intact, but he has some fluid within the sheath distally.  Subscapularis tendon has chronic degenerative change but no obvious acute tear.  The anterior to mid supraspinatus tendon appears to have a full-thickness partially retracted tear.  Infraspinatus tendon is unremarkable, no obvious posterior labrum tear seen.    Assessment & Plan: 1.  Acute right shoulder pain with exam and ultrasound concerning for retracted supraspinatus tear. -MRI to confirm, follow-up with Dr. Erlinda Dorsey to discuss results and probable surgical repair. -On any medication presents.  2.  Chronic left shoulder pain, etiology uncertain.  Could be myofascial pain. -Once his acute right shoulder is improving, consider physical therapy for this.     Procedures: No procedures performed  No notes on file     PMFS History: Patient Active  Problem List   Diagnosis Date Noted  . Family history of early CAD 07/22/2018  . High cholesterol 05/29/2016  . Erectile dysfunction 05/14/2015  . GERD (gastroesophageal reflux disease) 01/12/2013  . NEPHROLITHIASIS, HX OF 02/13/2008   Past Medical History:  Diagnosis Date  . Kidney stone    x2  . Reflux     Family History  Problem Relation Age of Onset  . Arthritis Mother   . Heart disease Father 21       CAD  . Colon polyps Father   . Colon cancer Neg Hx   . Esophageal cancer Neg Hx   . Rectal cancer Neg Hx   . Stomach cancer Neg Hx     Past Surgical History:  Procedure  Laterality Date  . WRIST SURGERY Left 29 years ago   Social History   Occupational History  . Not on file  Tobacco Use  . Smoking status: Never Smoker  . Smokeless tobacco: Never Used  Substance and Sexual Activity  . Alcohol use: No  . Drug use: No  . Sexual activity: Not on file

## 2019-04-17 NOTE — Patient Instructions (Signed)
   Rotator cuff (supraspinatus tendon) looks torn.  Will order an MRI to confirm, and discuss with Dr. Erlinda Hong about possible surgery.

## 2019-04-20 ENCOUNTER — Other Ambulatory Visit: Payer: Self-pay | Admitting: Family Medicine

## 2019-04-20 ENCOUNTER — Telehealth: Payer: Self-pay | Admitting: Orthopaedic Surgery

## 2019-04-20 MED ORDER — TRAMADOL HCL 50 MG PO TABS: 50.0000 mg | ORAL_TABLET | Freq: Four times a day (QID) | ORAL | 0 refills | Status: DC | PRN

## 2019-04-20 NOTE — Telephone Encounter (Signed)
Please advise 

## 2019-04-20 NOTE — Telephone Encounter (Signed)
Looks like dr. Junius Roads patient.  We have not seen him yet.  Best to see what medicine he recommends

## 2019-04-20 NOTE — Telephone Encounter (Signed)
Tramadol Rx sent 

## 2019-04-20 NOTE — Telephone Encounter (Signed)
Patient's wife Laureen called asked if patient can get something called in for pain. The [facility] [place] or [facility] [place]. The number to contact patient is  [phone]

## 2019-04-20 NOTE — Telephone Encounter (Signed)
Patient aware.

## 2019-04-22 ENCOUNTER — Ambulatory Visit
Admission: RE | Admit: 2019-04-22 | Discharge: 2019-04-22 | Disposition: A | Discharge status: Home / Self Care | Payer: 59 | Source: Ambulatory Visit | Attending: Family Medicine | Admitting: Family Medicine

## 2019-04-22 ENCOUNTER — Other Ambulatory Visit: Payer: Self-pay | Admitting: Family Medicine

## 2019-04-22 ENCOUNTER — Other Ambulatory Visit: Payer: Self-pay

## 2019-04-22 DIAGNOSIS — M25511 Pain in right shoulder: Secondary | ICD-10-CM

## 2019-04-24 ENCOUNTER — Other Ambulatory Visit: Payer: 59

## 2019-04-24 ENCOUNTER — Telehealth: Payer: Self-pay | Admitting: Family Medicine

## 2019-04-24 NOTE — Telephone Encounter (Signed)
Has an appointment with Dr. Erlinda Hong tomorrow morning to discuss results.

## 2019-04-24 NOTE — Telephone Encounter (Signed)
MRI confirms rotator cuff tear.

## 2019-04-25 ENCOUNTER — Encounter: Payer: Self-pay | Admitting: Orthopaedic Surgery

## 2019-04-25 ENCOUNTER — Ambulatory Visit (INDEPENDENT_AMBULATORY_CARE_PROVIDER_SITE_OTHER): Payer: 59 | Admitting: Orthopaedic Surgery

## 2019-04-25 ENCOUNTER — Other Ambulatory Visit: Payer: Self-pay

## 2019-04-25 DIAGNOSIS — M67921 Unspecified disorder of synovium and tendon, right upper arm: Secondary | ICD-10-CM

## 2019-04-25 DIAGNOSIS — M24111 Other articular cartilage disorders, right shoulder: Secondary | ICD-10-CM | POA: Diagnosis not present

## 2019-04-25 DIAGNOSIS — S46811A Strain of other muscles, fascia and tendons at shoulder and upper arm level, right arm, initial encounter: Secondary | ICD-10-CM

## 2019-04-25 DIAGNOSIS — G8929 Other chronic pain: Secondary | ICD-10-CM

## 2019-04-25 DIAGNOSIS — M7541 Impingement syndrome of right shoulder: Secondary | ICD-10-CM

## 2019-04-25 DIAGNOSIS — M25512 Pain in left shoulder: Secondary | ICD-10-CM | POA: Diagnosis not present

## 2019-04-25 NOTE — Progress Notes (Signed)
Subjective: Patient is here for ultrasound-guided intra-articular left glenohumeral injection.  Chronic posterior pain.  Objective:  Pain with overhead and behind the back reach.  Procedure: Ultrasound-guided left glenohumeral injection: After sterile prep with Betadine, injected 8 cc 1% lidocaine without epinephrine and 40 mg methylprednisolone using a 22-gauge spinal needle, passing the needle through approach into the glenohumeral joint.  Injectate seen filling joint capsule.  Modest improvement during anesthetic phase.

## 2019-04-25 NOTE — Progress Notes (Signed)
Office Visit Note   Patient: Gregory Dorsey           Date of Birth: [birth year]           MRN: [id number] Visit Date: 04/25/2019              Requested by: Gregory Sanders, MD [place],  [place] [address] PCP: Gregory Sanders, MD   Assessment & Plan: Visit Diagnoses:  1. Traumatic tear of supraspinatus tendon of right shoulder, initial encounter   2. Impingement syndrome of right shoulder   3. Tendinopathy of right biceps tendon   4. 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 Chronic left shoulder pain     Plan: MRI of the right shoulder shows a full-thickness supraspinatus tear with 7 cm of retraction and 11 mm wide.  He has intra-articular biceps tendinopathy and advanced AC joint arthrosis as well as degenerative changes of the glenohumeral joint and the labrum.  All these findings were discussed with the patient and the images were reviewed with he and his wife today.  Given the findings on MRI I have recommended arthroscopic rotator cuff repair and debridements and biceps tenodesis as indicated based on intraoperative findings.  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  He understands the risks and benefits of the surgery and the reasonable expected outcome.  Questions encouraged and answered.  Follow-Up Instructions: No follow-ups on file.   Orders:  No orders of the defined types were placed in this encounter.  No orders of the defined types were placed in this encounter.     Procedures: No procedures performed   Clinical Data: No additional findings.   Subjective: Chief Complaint  Patient presents with  . Right Shoulder - Follow-up    Gregory Dorsey comes in today for follow-up of his right shoulder MRI.  He is also complaining of left shoulder pain with grinding and popping.  He has  good strength with shoulder elevation and rotator cuff use of the left shoulder.  In terms of the right shoulder he is unable to move his arm much away from the side of his body ever since he fell.   Review of Systems  Constitutional: Negative.   All other systems reviewed and are negative.    Objective: Vital Signs: There were no vitals taken for this visit.  Physical Exam Vitals signs and nursing note reviewed.  Constitutional:      Appearance: He is well-developed.  Pulmonary:     Effort: Pulmonary effort is normal.  Abdominal:     Palpations: Abdomen is soft.  Skin:    General: Skin is warm.  Neurological:     Mental Status: He is alert and oriented to person, place, and time.  Psychiatric:        Behavior: Behavior normal.        Thought Content: Thought content normal.        Judgment: Judgment normal.     Ortho Exam Right shoulder exam shows significant weakness with initiation of supraspinatus function.  Passive range of motion is normal.  Positive drop arm and positive empty can.  Positive cross adduction body.  Positive impingement. Specialty Comments:  No specialty comments available.  Imaging: No results found.   PMFS History: Patient Active Problem List  Diagnosis Date Noted  . Family history of early CAD 07/22/2018  . High cholesterol 05/29/2016  . Erectile dysfunction 05/14/2015  . GERD (gastroesophageal reflux disease) 01/12/2013  . NEPHROLITHIASIS, HX OF 02/13/2008   Past Medical History:  Diagnosis Date  . Kidney stone    x2  . Reflux     Family History  Problem Relation Age of Onset  . Arthritis Mother   . Heart disease Father 62       CAD  . Colon polyps Father   . Colon cancer Neg Hx   . Esophageal cancer Neg Hx   . Rectal cancer Neg Hx   . Stomach cancer Neg Hx     Past Surgical History:  Procedure Laterality Date  . WRIST SURGERY Left 29 years ago   Social History   Occupational History  . Not on file  Tobacco Use  .  Smoking status: Never Smoker  . Smokeless tobacco: Never Used  Substance and Sexual Activity  . Alcohol use: No  . Drug use: No  . Sexual activity: Not on file

## 2019-05-11 ENCOUNTER — Other Ambulatory Visit: Payer: Self-pay | Admitting: Physician Assistant

## 2019-05-11 ENCOUNTER — Encounter: Payer: Self-pay | Admitting: Orthopaedic Surgery

## 2019-05-11 DIAGNOSIS — M75121 Complete rotator cuff tear or rupture of right shoulder, not specified as traumatic: Secondary | ICD-10-CM

## 2019-05-11 DIAGNOSIS — M7541 Impingement syndrome of right shoulder: Secondary | ICD-10-CM

## 2019-05-11 DIAGNOSIS — M67911 Unspecified disorder of synovium and tendon, right shoulder: Secondary | ICD-10-CM

## 2019-05-11 DIAGNOSIS — M19011 Primary osteoarthritis, right shoulder: Secondary | ICD-10-CM

## 2019-05-11 MED ORDER — ONDANSETRON HCL 4 MG PO TABS: 4.0000 mg | ORAL_TABLET | Freq: Three times a day (TID) | ORAL | 0 refills | Status: DC | PRN

## 2019-05-11 MED ORDER — OXYCODONE-ACETAMINOPHEN 5-325 MG PO TABS: 1.0000 | ORAL_TABLET | Freq: Four times a day (QID) | ORAL | 0 refills | Status: DC | PRN

## 2019-05-15 ENCOUNTER — Telehealth: Payer: Self-pay

## 2019-05-15 NOTE — Telephone Encounter (Signed)
done

## 2019-05-15 NOTE — Telephone Encounter (Signed)
Patient would like a call back from you.  Patient had right shoulder surgery on Thursday, 05/11/2019.  Stated that his right shoulder feels funny, and that he is not sure how it is suppose to feel, but states that something is not right.  Cb# is [phone].  Please advise.  Thank you.

## 2019-05-16 NOTE — Telephone Encounter (Signed)
Noted  

## 2019-05-25 ENCOUNTER — Ambulatory Visit (INDEPENDENT_AMBULATORY_CARE_PROVIDER_SITE_OTHER): Payer: 59 | Admitting: Orthopaedic Surgery

## 2019-05-25 ENCOUNTER — Other Ambulatory Visit: Payer: Self-pay

## 2019-05-25 ENCOUNTER — Encounter: Payer: Self-pay | Admitting: Orthopaedic Surgery

## 2019-05-25 DIAGNOSIS — S46811A Strain of other muscles, fascia and tendons at shoulder and upper arm level, right arm, initial encounter: Secondary | ICD-10-CM

## 2019-05-25 DIAGNOSIS — M67921 Unspecified disorder of synovium and tendon, right upper arm: Secondary | ICD-10-CM

## 2019-05-25 DIAGNOSIS — M7541 Impingement syndrome of right shoulder: Secondary | ICD-10-CM

## 2019-05-25 NOTE — Progress Notes (Signed)
Patient ID: Gregory Dorsey, male   DOB: 08/06/1966, 53 y.o.   MRN: [id number]  [name] is two-week status post right shoulder arthroscopy with rotator cuff repair and debridement and biceps tenodesis.  He is overall doing well.  He states that he has had only a couple episodes of pain in which he needed to take painkiller.  He is overall doing well.  Denies any constitutional symptoms.  The surgical incisions are fully healed without any signs of infection.  Neurovascular intact distally.  We remove the sutures today and placed Steri-Strips.  His passive range of motion is fairly decent.  We have given him a referral to [facility] physical therapy.  He will call back if he needs any refills on pain medicines.  Questions encouraged and answered.

## 2019-06-22 ENCOUNTER — Ambulatory Visit (INDEPENDENT_AMBULATORY_CARE_PROVIDER_SITE_OTHER): Payer: 59 | Admitting: Orthopaedic Surgery

## 2019-06-22 ENCOUNTER — Encounter: Payer: Self-pay | Admitting: Orthopaedic Surgery

## 2019-06-22 VITALS — Ht 72.0 in | Wt 215.0 lb

## 2019-06-22 DIAGNOSIS — S46811D Strain of other muscles, fascia and tendons at shoulder and upper arm level, right arm, subsequent encounter: Secondary | ICD-10-CM

## 2019-06-22 DIAGNOSIS — S46811A Strain of other muscles, fascia and tendons at shoulder and upper arm level, right arm, initial encounter: Secondary | ICD-10-CM | POA: Insufficient documentation

## 2019-06-22 NOTE — Progress Notes (Signed)
Patient ID: Gregory Dorsey, male   DOB: 12-28-1965, 53 y.o.   MRN: [id number]  [name] is 6 weeks status post right rotator cuff repair.  He is overall doing well has been doing home exercises for range of motion.  He denies any pain.  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 His surgical scars are fully healed.  His range of motion has come along very well.  He is able to actively raise his arm to the level of the shoulder.  At this point we can discontinue the sling.  He will begin strengthening and weightbearing with physical therapy.  He is going to [facility] physical therapy.  I would like him to limit his lifting to 15 pounds until he is stronger.  Activity restrictions and limitations were discussed today.  We will see him back in another 6 weeks for recheck.

## 2019-07-19 ENCOUNTER — Telehealth: Payer: Self-pay | Admitting: Family Medicine

## 2019-07-19 DIAGNOSIS — Z125 Encounter for screening for malignant neoplasm of prostate: Secondary | ICD-10-CM

## 2019-07-19 DIAGNOSIS — E7800 Pure hypercholesterolemia, unspecified: Secondary | ICD-10-CM

## 2019-07-19 NOTE — Telephone Encounter (Signed)
-----   Message from Cloyd Stagers, RT sent at 07/11/2019  1:44 PM EDT ----- Regarding: Lab Orders for Thursday 9.17.2020 Please place lab orders for Thursday 9.17.2020, office visit for physical on Tuesday 9.22.2020 Thank you, Dyke Maes RT(R)

## 2019-07-20 ENCOUNTER — Other Ambulatory Visit: Payer: Self-pay

## 2019-07-20 ENCOUNTER — Other Ambulatory Visit (INDEPENDENT_AMBULATORY_CARE_PROVIDER_SITE_OTHER): Payer: 59

## 2019-07-20 DIAGNOSIS — Z125 Encounter for screening for malignant neoplasm of prostate: Secondary | ICD-10-CM | POA: Diagnosis not present

## 2019-07-20 DIAGNOSIS — E7800 Pure hypercholesterolemia, unspecified: Secondary | ICD-10-CM | POA: Diagnosis not present

## 2019-07-20 LAB — LIPID PANEL
Cholesterol: 210 mg/dL — ABNORMAL HIGH (ref 0–200)
HDL: 58.5 mg/dL (ref >39.00)
LDL Cholesterol: 131 mg/dL — ABNORMAL HIGH (ref 0–99)
NonHDL: 151.53
Total CHOL/HDL Ratio: 4
Triglycerides: 102 mg/dL (ref 0.0–149.0)
VLDL: 20.4 mg/dL (ref 0.0–40.0)

## 2019-07-20 LAB — COMPREHENSIVE METABOLIC PANEL
ALT: 19 U/L (ref 0–53)
AST: 15 U/L (ref 0–37)
Albumin: 4.6 g/dL (ref 3.5–5.2)
Alkaline Phosphatase: 76 U/L (ref 39–117)
BUN: 14 mg/dL (ref 6–23)
CO2: 28 mEq/L (ref 19–32)
Calcium: 10.1 mg/dL (ref 8.4–10.5)
Chloride: 103 mEq/L (ref 96–112)
Creatinine, Ser: 0.94 mg/dL (ref 0.40–1.50)
GFR: 83.9 mL/min (ref >60.00)
Glucose, Bld: 84 mg/dL (ref 70–99)
Potassium: 4.3 mEq/L (ref 3.5–5.1)
Sodium: 138 mEq/L (ref 135–145)
Total Bilirubin: 1.1 mg/dL (ref 0.2–1.2)
Total Protein: 7 g/dL (ref 6.0–8.3)

## 2019-07-20 LAB — PSA: PSA: 0.81 ng/mL (ref 0.10–4.00)

## 2019-07-20 NOTE — Progress Notes (Signed)
No critical labs need to be addressed urgently. We will discuss labs in detail at upcoming office visit.   

## 2019-07-25 ENCOUNTER — Ambulatory Visit (INDEPENDENT_AMBULATORY_CARE_PROVIDER_SITE_OTHER): Payer: 59 | Admitting: Family Medicine

## 2019-07-25 ENCOUNTER — Encounter: Payer: Self-pay | Admitting: Family Medicine

## 2019-07-25 ENCOUNTER — Other Ambulatory Visit: Payer: Self-pay

## 2019-07-25 ENCOUNTER — Encounter: Payer: 59 | Admitting: Family Medicine

## 2019-07-25 VITALS — BP 138/92 | HR 82 | Temp 97.8°F | Ht 71.0 in | Wt 217.2 lb

## 2019-07-25 DIAGNOSIS — Z0000 Encounter for general adult medical examination without abnormal findings: Secondary | ICD-10-CM | POA: Diagnosis not present

## 2019-07-25 DIAGNOSIS — Z23 Encounter for immunization: Secondary | ICD-10-CM

## 2019-07-25 DIAGNOSIS — E7800 Pure hypercholesterolemia, unspecified: Secondary | ICD-10-CM | POA: Diagnosis not present

## 2019-07-25 NOTE — Assessment & Plan Note (Signed)
Offered chol med statin given family history.. pt refused. Encouraged exercise, weight loss, healthy eating habits.

## 2019-07-25 NOTE — Addendum Note (Signed)
Addended by: Carter Kitten on: 07/25/2019 10:58 AM   Modules accepted: Orders

## 2019-07-25 NOTE — Progress Notes (Signed)
Chief Complaint  Patient presents with  . Annual Exam    History of Present Illness: HPI The patient is here for annual wellness exam and preventative care.    Elevated Cholesterol:  LDL above goal on no medication. 4.9% 10 year risk of CAD. Lab Results  Component Value Date   CHOL 210 (H) 07/20/2019   HDL 58.50 07/20/2019   LDLCALC 131 (H) 07/20/2019   LDLDIRECT 117.7 01/19/2013   TRIG 102.0 07/20/2019   CHOLHDL 4 07/20/2019  Using medications without problems: Muscle aches:  Diet compliance: moderate Exercise: active lifestyle, no other. Other complaints: Family history of CAD. 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  The importance of social distancing was discussed today.   Review of Systems  Constitutional: Negative for chills and fever.  HENT: Negative for congestion and ear pain.   Eyes: Negative for pain and redness.  Respiratory: Negative for cough and shortness of breath.   Cardiovascular: Negative for chest pain, palpitations and leg swelling.  Gastrointestinal: Negative for abdominal pain, blood in stool, constipation, diarrhea, nausea and vomiting.  Genitourinary: Negative for dysuria.  Musculoskeletal: Negative for falls and myalgias.  Skin: Negative for rash.  Neurological: Negative for dizziness.  Psychiatric/Behavioral: Negative for depression. The patient is not nervous/anxious.       Past Medical History:  Diagnosis Date  . Kidney stone    x2  . Reflux     reports that he has never smoked. He has never used smokeless  tobacco. He reports that he does not drink alcohol or use drugs.  No current outpatient medications on file.   Observations/Objective: Blood pressure (!) 138/92, pulse 82, temperature 97.8 F (36.6 C), height 5\' 11"  (1.803 m), weight 217 lb 4 oz (98.5 kg), SpO2 98 %.  Physical Exam Constitutional:      General: He is not in acute distress.    Appearance: Normal appearance. He is well-developed. He is not ill-appearing or toxic-appearing.  HENT:     Head: Normocephalic and atraumatic.     Right Ear: Hearing, tympanic membrane, ear canal and external ear normal.     Left Ear: Hearing, tympanic membrane, ear canal and external ear normal.     Nose: Nose normal.     Mouth/Throat:     Pharynx: Uvula midline.  Eyes:     General: Lids are normal. Lids are everted, no foreign bodies appreciated.     Conjunctiva/sclera: Conjunctivae normal.     Pupils: Pupils are equal, round, and reactive to light.  Neck:     Musculoskeletal: Normal range of motion and neck supple.     Thyroid: No thyroid mass or thyromegaly.     Vascular: No carotid bruit.     Trachea: Trachea and phonation normal.  Cardiovascular:     Rate and Rhythm: Normal rate and regular rhythm.     Pulses: Normal pulses.     Heart sounds: S1 normal and S2 normal. No murmur. No gallop.  Pulmonary:     Breath sounds: Normal breath sounds. No wheezing, rhonchi or rales.  Abdominal:     General: Bowel sounds are normal.     Palpations: Abdomen is soft.     Tenderness: There is no abdominal tenderness. There is no guarding or rebound.     Hernia: No hernia is present.  Lymphadenopathy:     Cervical: No cervical adenopathy.  Skin:    General: Skin is warm and dry.     Findings: No rash.  Neurological:     Mental Status: He is alert.     Cranial Nerves: No cranial nerve deficit.     Sensory: No sensory deficit.     Gait: Gait normal.     Deep Tendon Reflexes: Reflexes are normal and symmetric.  Psychiatric:        Speech:  Speech normal.        Behavior: Behavior normal.        Judgment: Judgment normal.      Assessment and Plan   The patient's preventative maintenance and recommended screening tests for an annual wellness exam were reviewed in full today. Brought up to date unless services declined.  Counselled on the importance of diet, exercise, and its role in overall health and mortality. The patient's FH and SH was reviewed, including their home life, tobacco status, and drug and alcohol status.   Vaccines:uptodate Tdap,  Given flu vaccine No family history of early prostate/colon cancer . Lab Results  Component Value Date   PSA 0.81 07/20/2019   PSA 0.78 07/15/2018   PSA 0.78 07/15/2017  Colon: 10/2018 colonoscopy, polyp  Dr. Lyndel Safe, repeat 5 years Not interested in STD testing Non smoker. No ETOH use.    Eliezer Lofts, MD

## 2019-07-25 NOTE — Patient Instructions (Addendum)
Get back to regular activity. Decrease animal fats in diet.  Follow BP at home... goal < 140/90

## 2019-07-27 ENCOUNTER — Other Ambulatory Visit: Payer: 59

## 2019-08-01 ENCOUNTER — Encounter: Payer: 59 | Admitting: Family Medicine

## 2019-08-03 ENCOUNTER — Encounter: Payer: Self-pay | Admitting: Orthopaedic Surgery

## 2019-08-03 ENCOUNTER — Ambulatory Visit (INDEPENDENT_AMBULATORY_CARE_PROVIDER_SITE_OTHER): Payer: 59 | Admitting: Orthopaedic Surgery

## 2019-08-03 ENCOUNTER — Ambulatory Visit: Payer: Self-pay

## 2019-08-03 ENCOUNTER — Other Ambulatory Visit: Payer: Self-pay

## 2019-08-03 DIAGNOSIS — G8929 Other chronic pain: Secondary | ICD-10-CM | POA: Diagnosis not present

## 2019-08-03 DIAGNOSIS — Z9889 Other specified postprocedural states: Secondary | ICD-10-CM

## 2019-08-03 DIAGNOSIS — S46811D Strain of other muscles, fascia and tendons at shoulder and upper arm level, right arm, subsequent encounter: Secondary | ICD-10-CM | POA: Diagnosis not present

## 2019-08-03 DIAGNOSIS — M25512 Pain in left shoulder: Secondary | ICD-10-CM | POA: Diagnosis not present

## 2019-08-03 NOTE — Progress Notes (Signed)
Subjective: Patient is here for ultrasound-guided intra-articular left glenohumeral injection.  Pain mostly anterior with radiation toward the neck.  Objective:  Good ROM but tender near long head biceps tendon.  Some posterior tenderness.  Procedure: Ultrasound-guided left glenohumeral injection: After sterile prep with Betadine, injected 8 cc 1% lidocaine without epinephrine and 40 mg methylprednisolone using a 22-gauge spinal needle, passing the needle through approach into the glenohumeral joint.  Injectate seen filling joint capsule.  If no relief, could inject biceps tendon.

## 2019-08-03 NOTE — Progress Notes (Signed)
Office Visit Note   Patient: Gregory Dorsey           Date of Birth: 03/02/1966           MRN: [id number] Visit Date: 08/03/2019              Requested by: Jinny Sanders, MD [place],  [place] [address] PCP: Jinny Sanders, MD   Assessment & Plan: Visit Diagnoses:  1. Traumatic tear of supraspinatus tendon of right shoulder, subsequent encounter   2. S/P arthroscopy of right shoulder   3. Chronic left shoulder pain     Plan: Impression is 3 months status post right rotator cuff repair and doing well.  Resumed all activities essentially.  We will have Dr. Junius Roads reinject his left shoulder today.  If this pain returns we will need to obtain an MRI to evaluate for structural abnormalities.  Questions encouraged and answered.  I would like to see him back in about 2 months for recheck.  Follow-Up Instructions: Return in about 2 months (around 10/03/2019).   Orders:  Orders Placed This Encounter  Procedures  . XR Shoulder Left   No orders of the defined types were placed in this encounter.     Procedures: No procedures performed   Clinical Data: No additional findings.   Subjective: Chief Complaint  Patient presents with  . Right Shoulder - Pain, Follow-up    [name] is a 53 year old gentleman who is 3 months status post right rotator cuff repair.  He is doing well overall from this.  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  He has some occasional soreness and stiffness and discomfort but overall is very happy.  He is here also for evaluation of continued left shoulder pain.  He previously had a intra-articular injection sometime in July which gave him good relief for about 2 to 3 months.  He states that the pain has recurred and is hoping to get another injection.  He does endorse some radiation of pain into his axilla and chest and into the neck.  Denies any shortness of breath or chest pain.   Review of Systems  Constitutional: Negative.    All other systems reviewed and are negative.    Objective: Vital Signs: There were no vitals taken for this visit.  Physical Exam Vitals signs and nursing note reviewed.  Constitutional:      Appearance: He is well-developed.  Pulmonary:     Effort: Pulmonary effort is normal.  Abdominal:     Palpations: Abdomen is soft.  Skin:    General: Skin is warm.  Neurological:     Mental Status: He is alert and oriented to person, place, and time.  Psychiatric:        Behavior: Behavior normal.        Thought Content: Thought content normal.        Judgment: Judgment normal.     Ortho Exam Right shoulder exam shows good active and passive range of motion.  Rotator cuff strength is good manual muscle testing. Left shoulder exam shows good rotator cuff strength.  Positive crank test.  Negative Speed negative impingement negative empty can. Specialty Comments:  No specialty comments available.  Imaging: No results found.   PMFS History: Patient Active Problem List   Diagnosis Date Noted  . Traumatic tear of supraspinatus tendon of right shoulder 06/22/2019  . Family history of early CAD 07/22/2018  . High cholesterol 05/29/2016  .  Erectile dysfunction 05/14/2015  . GERD (gastroesophageal reflux disease) 01/12/2013  . NEPHROLITHIASIS, HX OF 02/13/2008   Past Medical History:  Diagnosis Date  . Kidney stone    x2  . Reflux     Family History  Problem Relation Age of Onset  . Arthritis Mother   . Heart disease Father 13       CAD  . Colon polyps Father   . Colon cancer Neg Hx   . Esophageal cancer Neg Hx   . Rectal cancer Neg Hx   . Stomach cancer Neg Hx     Past Surgical History:  Procedure Laterality Date  . WRIST SURGERY Left 29 years ago   Social History   Occupational History  . Not on file  Tobacco Use  . Smoking status: Never Smoker  . Smokeless tobacco: Never Used  Substance and Sexual Activity  . Alcohol use: No  . Drug use: No  . Sexual  activity: Not on file

## 2019-08-03 NOTE — Progress Notes (Deleted)
   Post-Op Visit Note   Patient: Gregory Dorsey           Date of Birth: 06-01-1966           MRN: [id number] Visit Date: 08/03/2019 PCP: Jinny Sanders, MD   Assessment & Plan:  Chief Complaint:  Chief Complaint  Patient presents with  . Right Shoulder - Pain, Follow-up   Visit Diagnoses:  1. Traumatic tear of supraspinatus tendon of right shoulder, subsequent encounter   2. S/P arthroscopy of right shoulder     Plan: ***  Follow-Up Instructions: No follow-ups on file.   Orders:  No orders of the defined types were placed in this encounter.  No orders of the defined types were placed in this encounter.   Imaging: No results found.  PMFS History: Patient Active Problem List   Diagnosis Date Noted  . Traumatic tear of supraspinatus tendon of right shoulder 06/22/2019  . Family history of early CAD 07/22/2018  . High cholesterol 05/29/2016  . Erectile dysfunction 05/14/2015  . GERD (gastroesophageal reflux disease) 01/12/2013  . NEPHROLITHIASIS, HX OF 02/13/2008   Past Medical History:  Diagnosis Date  . Kidney stone    x2  . Reflux     Family History  Problem Relation Age of Onset  . Arthritis Mother   . Heart disease Father 53       CAD  . Colon polyps Father   . Colon cancer Neg Hx   . Esophageal cancer Neg Hx   . Rectal cancer Neg Hx   . Stomach cancer Neg Hx     Past Surgical History:  Procedure Laterality Date  . WRIST SURGERY Left 29 years ago   Social History   Occupational History  . Not on file  Tobacco Use  . Smoking status: Never Smoker  . Smokeless tobacco: Never Used  Substance and Sexual Activity  . Alcohol use: No  . Drug use: No  . Sexual activity: Not on file

## 2019-09-04 ENCOUNTER — Encounter: Payer: Self-pay | Admitting: Orthopaedic Surgery

## 2019-10-05 ENCOUNTER — Ambulatory Visit: Payer: 59 | Admitting: Orthopaedic Surgery

## 2020-07-29 ENCOUNTER — Other Ambulatory Visit (INDEPENDENT_AMBULATORY_CARE_PROVIDER_SITE_OTHER): Payer: 59

## 2020-07-29 ENCOUNTER — Other Ambulatory Visit: Payer: Self-pay

## 2020-07-29 ENCOUNTER — Telehealth: Payer: Self-pay | Admitting: Family Medicine

## 2020-07-29 DIAGNOSIS — E785 Hyperlipidemia, unspecified: Secondary | ICD-10-CM

## 2020-07-29 DIAGNOSIS — E1169 Type 2 diabetes mellitus with other specified complication: Secondary | ICD-10-CM

## 2020-07-29 DIAGNOSIS — Z125 Encounter for screening for malignant neoplasm of prostate: Secondary | ICD-10-CM

## 2020-07-29 DIAGNOSIS — Z1159 Encounter for screening for other viral diseases: Secondary | ICD-10-CM | POA: Diagnosis not present

## 2020-07-29 LAB — LIPID PANEL
Cholesterol: 188 mg/dL (ref 0–200)
HDL: 64.7 mg/dL (ref >39.00)
LDL Cholesterol: 110 mg/dL — ABNORMAL HIGH (ref 0–99)
NonHDL: 122.88
Total CHOL/HDL Ratio: 3
Triglycerides: 66 mg/dL (ref 0.0–149.0)
VLDL: 13.2 mg/dL (ref 0.0–40.0)

## 2020-07-29 LAB — COMPREHENSIVE METABOLIC PANEL
ALT: 19 U/L (ref 0–53)
AST: 15 U/L (ref 0–37)
Albumin: 4.5 g/dL (ref 3.5–5.2)
Alkaline Phosphatase: 81 U/L (ref 39–117)
BUN: 14 mg/dL (ref 6–23)
CO2: 29 mEq/L (ref 19–32)
Calcium: 9.4 mg/dL (ref 8.4–10.5)
Chloride: 103 mEq/L (ref 96–112)
Creatinine, Ser: 1 mg/dL (ref 0.40–1.50)
GFR: 77.82 mL/min (ref >60.00)
Glucose, Bld: 104 mg/dL — ABNORMAL HIGH (ref 70–99)
Potassium: 4.9 mEq/L (ref 3.5–5.1)
Sodium: 139 mEq/L (ref 135–145)
Total Bilirubin: 0.8 mg/dL (ref 0.2–1.2)
Total Protein: 6.9 g/dL (ref 6.0–8.3)

## 2020-07-29 LAB — PSA: PSA: 0.89 ng/mL (ref 0.10–4.00)

## 2020-07-29 NOTE — Telephone Encounter (Signed)
-----   Message from Ellamae Sia sent at 07/16/2020 12:21 PM EDT ----- Regarding: Lab orders for Monday. 9.27.21 Patient is scheduled for CPX labs, please order future labs, Thanks , Karna Christmas

## 2020-07-30 LAB — HEPATITIS C ANTIBODY
Hepatitis C Ab: NONREACTIVE
SIGNAL TO CUT-OFF: 0.01 (ref <1.00)

## 2020-07-30 NOTE — Progress Notes (Signed)
No critical labs need to be addressed urgently. We will discuss labs in detail at upcoming office visit.   

## 2020-08-01 ENCOUNTER — Other Ambulatory Visit: Payer: Self-pay

## 2020-08-01 ENCOUNTER — Ambulatory Visit (INDEPENDENT_AMBULATORY_CARE_PROVIDER_SITE_OTHER): Payer: 59 | Admitting: Family Medicine

## 2020-08-01 ENCOUNTER — Encounter: Payer: Self-pay | Admitting: Family Medicine

## 2020-08-01 VITALS — BP 140/86 | HR 84 | Temp 98.3°F | Ht 71.0 in | Wt 211.0 lb

## 2020-08-01 DIAGNOSIS — E7800 Pure hypercholesterolemia, unspecified: Secondary | ICD-10-CM | POA: Diagnosis not present

## 2020-08-01 DIAGNOSIS — R7303 Prediabetes: Secondary | ICD-10-CM

## 2020-08-01 DIAGNOSIS — R030 Elevated blood-pressure reading, without diagnosis of hypertension: Secondary | ICD-10-CM

## 2020-08-01 DIAGNOSIS — Z0000 Encounter for general adult medical examination without abnormal findings: Secondary | ICD-10-CM | POA: Diagnosis not present

## 2020-08-01 NOTE — Progress Notes (Signed)
Chief Complaint  Patient presents with  . Annual Exam    History of Present Illness: HPI  The patient is here for annual wellness exam and preventative care.    Elevated Cholesterol: Improved from last year with lifestyle changes Lab Results  Component Value Date   CHOL 188 07/29/2020   HDL 64.70 07/29/2020   LDLCALC 110 (H) 07/29/2020   LDLDIRECT 117.7 01/19/2013   TRIG 66.0 07/29/2020   CHOLHDL 3 07/29/2020  Using medications without problems: Muscle aches:  Diet compliance: moderate Exercise: very active Other complaints: Family history of CAD The 10-year ASCVD risk score Mikey Bussing DC Jr., et al., 2013) is: 8.3%   Values used to calculate the score:     Age: 54 years     Sex: Male     Is Non-Hispanic African American: No     Diabetic: Yes     Tobacco smoker: No     Systolic Blood Pressure: 629 mmHg     Is BP treated: No     HDL Cholesterol: 64.7 mg/dL     Total Cholesterol: 188 mg/dL  Prediabetes:  Glucose 104  Body mass index is 29.43 kg/m.  Wt Readings from Last 3 Encounters:  08/01/20 211 lb (95.7 kg)  07/25/19 217 lb 4 oz (98.5 kg)  06/22/19 215 lb (97.5 kg)      Not checking at home. BP Readings from Last 3 Encounters:  08/01/20 140/86  07/25/19 (!) 138/92  10/10/18 109/68     This visit occurred during the SARS-CoV-2 public health emergency.  Safety protocols were in place, including screening questions prior to the visit, additional usage of staff PPE, and extensive cleaning of exam room while observing appropriate contact time as indicated for disinfecting solutions.   COVID 19 screen:  No recent travel or known exposure to COVID19 The patient denies respiratory symptoms of COVID 19 at this time. The importance of social distancing was discussed today.     Review of Systems  Constitutional: Negative for chills and fever.  HENT: Negative for congestion and ear pain.   Eyes: Negative for pain and redness.  Respiratory: Negative for cough and  shortness of breath.   Cardiovascular: Negative for chest pain, palpitations and leg swelling.  Gastrointestinal: Negative for abdominal pain, blood in stool, constipation, diarrhea, nausea and vomiting.  Genitourinary: Negative for dysuria.  Musculoskeletal: Negative for falls and myalgias.  Skin: Negative for rash.  Neurological: Negative for dizziness.  Psychiatric/Behavioral: Negative for depression. The patient is not nervous/anxious.       Past Medical History:  Diagnosis Date  . Kidney stone    x2  . Reflux     reports that he has never smoked. He has never used smokeless tobacco. He reports that he does not drink alcohol and does not use drugs.  No current outpatient medications on file.   Observations/Objective: Blood pressure 140/86, pulse 84, temperature 98.3 F (36.8 C), temperature source Temporal, height 5\' 11"  (1.803 m), weight 211 lb (95.7 kg), SpO2 97 %.  Physical Exam Constitutional:      General: He is not in acute distress.    Appearance: Normal appearance. He is well-developed. He is not ill-appearing or toxic-appearing.  HENT:     Head: Normocephalic and atraumatic.     Right Ear: Hearing, tympanic membrane, ear canal and external ear normal.     Left Ear: Hearing, tympanic membrane, ear canal and external ear normal.     Nose: Nose normal.     Mouth/Throat:  Pharynx: Uvula midline.  Eyes:     General: Lids are normal. Lids are everted, no foreign bodies appreciated.     Conjunctiva/sclera: Conjunctivae normal.     Pupils: Pupils are equal, round, and reactive to light.  Neck:     Thyroid: No thyroid mass or thyromegaly.     Vascular: No carotid bruit.     Trachea: Trachea and phonation normal.  Cardiovascular:     Rate and Rhythm: Normal rate and regular rhythm.     Pulses: Normal pulses.     Heart sounds: S1 normal and S2 normal. No murmur heard.  No gallop.   Pulmonary:     Breath sounds: Normal breath sounds. No wheezing, rhonchi or rales.   Abdominal:     General: Bowel sounds are normal.     Palpations: Abdomen is soft.     Tenderness: There is no abdominal tenderness. There is no guarding or rebound.     Hernia: No hernia is present.  Musculoskeletal:     Cervical back: Normal range of motion and neck supple.  Lymphadenopathy:     Cervical: No cervical adenopathy.  Skin:    General: Skin is warm and dry.     Findings: No rash.  Neurological:     Mental Status: He is alert.     Cranial Nerves: No cranial nerve deficit.     Sensory: No sensory deficit.     Gait: Gait normal.     Deep Tendon Reflexes: Reflexes are normal and symmetric.  Psychiatric:        Speech: Speech normal.        Behavior: Behavior normal.        Judgment: Judgment normal.      Assessment and Plan   The patient's preventative maintenance and recommended screening tests for an annual wellness exam were reviewed in full today. Brought up to date unless services declined.  Counselled on the importance of diet, exercise, and its role in overall health and mortality. The patient's FH and SH was reviewed, including their home life, tobacco status, and drug and alcohol status.   Vaccines:uptodate Tdap,   refused flu,  Discussed COVID19 vaccine side effects and benefits. Strongly encouraged the patient to get the vaccine. Questions answered. No family history of early prostate/colon cancer . Lab Results  Component Value Date   PSA 0.89 07/29/2020   PSA 0.81 07/20/2019   PSA 0.78 07/15/2018  Colon: 10/2018 colonoscopy, polyp  Dr. Lyndel Safe, repeat 5 years Not interested in STD testing Non smoker. No ETOH use.  Elevated blood pressure reading in office without diagnosis of hypertension Encouraged exercise, weight loss, healthy eating habits.  Follow BP at home.  High cholesterol  Improving.. work on [employer]. Not interested in medicaiton to treat despite family history of CAD.  Prediabetes  Low carb diet.     Eliezer Lofts, MD

## 2020-08-01 NOTE — Assessment & Plan Note (Signed)
Low carb diet 

## 2020-08-01 NOTE — Patient Instructions (Addendum)
Get BP cuff and follow BP at  Home.. call or MyChart BP measurements and pulse in 2 weeks.  Work on diet, regular exercise and weight loss.   The 10-year ASCVD risk score Mikey Bussing DC Brooke Bonito., et al., 2013) is: 8.3%   Values used to calculate the score:     Age: 54 years     Sex: Male     Is Non-Hispanic African American: No     Diabetic: Yes     Tobacco smoker: No     Systolic Blood Pressure: 256 mmHg     Is BP treated: No     HDL Cholesterol: 64.7 mg/dL     Total Cholesterol: 188 mg/dL   Preventive Care 43-89 Years Old, Male Preventive care refers to lifestyle choices and visits with your health care provider that can promote health and wellness. This includes:  A yearly physical exam. This is also called an annual well check.  Regular dental and eye exams.  Immunizations.  Screening for certain conditions.  Healthy lifestyle choices, such as eating a healthy diet, getting regular exercise, not using drugs or products that contain nicotine and tobacco, and limiting alcohol use. What can I expect for my preventive care visit? Physical exam Your health care provider will check:  Height and weight. These may be used to calculate body mass index (BMI), which is a measurement that tells if you are at a healthy weight.  Heart rate and blood pressure.  Your skin for abnormal spots. Counseling Your health care provider may ask you questions about:  Alcohol, tobacco, and drug use.  Emotional well-being.  Home and relationship well-being.  Sexual activity.  Eating habits.  Work and work Statistician. What immunizations do I need?  Influenza (flu) vaccine  This is recommended every year. Tetanus, diphtheria, and pertussis (Tdap) vaccine  You may need a Td booster every 10 years. Varicella (chickenpox) vaccine  You may need this vaccine if you have not already been vaccinated. Zoster (shingles) vaccine  You may need this after age 67. Measles, mumps, and rubella (MMR)  vaccine  You may need at least one dose of MMR if you were born in 1957 or later. You may also need a second dose. Pneumococcal conjugate (PCV13) vaccine  You may need this if you have certain conditions and were not previously vaccinated. Pneumococcal polysaccharide (PPSV23) vaccine  You may need one or two doses if you smoke cigarettes or if you have certain conditions. Meningococcal conjugate (MenACWY) vaccine  You may need this if you have certain conditions. Hepatitis A vaccine  You may need this if you have certain conditions or if you travel or work in places where you may be exposed to hepatitis A. Hepatitis B vaccine  You may need this if you have certain conditions or if you travel or work in places where you may be exposed to hepatitis B. Haemophilus influenzae type b (Hib) vaccine  You may need this if you have certain risk factors. Human papillomavirus (HPV) vaccine  If recommended by your health care provider, you may need three doses over 6 months. You may receive vaccines as individual doses or as more than one vaccine together in one shot (combination vaccines). Talk with your health care provider about the risks and benefits of combination vaccines. What tests do I need? Blood tests  Lipid and cholesterol levels. These may be checked every 5 years, or more frequently if you are over 66 years old.  Hepatitis C test.  Hepatitis  B test. Screening  Lung cancer screening. You may have this screening every year starting at age 47 if you have a 30-pack-year history of smoking and currently smoke or have quit within the past 15 years.  Prostate cancer screening. Recommendations will vary depending on your family history and other risks.  Colorectal cancer screening. All adults should have this screening starting at age 88 and continuing until age 58. Your health care provider may recommend screening at age 39 if you are at increased risk. You will have tests every  1-10 years, depending on your results and the type of screening test.  Diabetes screening. This is done by checking your blood sugar (glucose) after you have not eaten for a while (fasting). You may have this done every 1-3 years.  Sexually transmitted disease (STD) testing. Follow these instructions at home: Eating and drinking  Eat a diet that includes fresh fruits and vegetables, whole grains, lean protein, and low-fat dairy products.  Take vitamin and mineral supplements as recommended by your health care provider.  Do not drink alcohol if your health care provider tells you not to drink.  If you drink alcohol: ? Limit how much you have to 0-2 drinks a day. ? Be aware of how much alcohol is in your drink. In the [place] one drink equals one 12 oz bottle of beer (355 mL), one 5 oz glass of wine (148 mL), or one 1 oz glass of hard liquor (44 mL). Lifestyle  Take daily care of your teeth and gums.  Stay active. Exercise for at least 30 minutes on 5 or more days each week.  Do not use any products that contain nicotine or tobacco, such as cigarettes, e-cigarettes, and chewing tobacco. If you need help quitting, ask your health care provider.  If you are sexually active, practice safe sex. Use a condom or other form of protection to prevent STIs (sexually transmitted infections).  Talk with your health care provider about taking a low-dose aspirin every day starting at age 82. What's next?  Go to your health care provider once a year for a well check visit.  Ask your health care provider how often you should have your eyes and teeth checked.  Stay up to date on all vaccines. This information is not intended to replace advice given to you by your health care provider. Make sure you discuss any questions you have with your health care provider. Document Revised: 10/13/2018 Document Reviewed: 10/13/2018 Elsevier Patient Education  2020 [employer].

## 2020-08-01 NOTE — Assessment & Plan Note (Signed)
Encouraged exercise, weight loss, healthy eating habits.  Follow BP at home.

## 2020-08-01 NOTE — Assessment & Plan Note (Signed)
Improving.. work on [employer]. Not interested in medicaiton to treat despite family history of CAD.

## 2021-06-17 ENCOUNTER — Emergency Department (HOSPITAL_COMMUNITY)
Admission: EM | Admit: 2021-06-17 | Discharge: 2021-06-17 | Disposition: A | Discharge status: Home / Self Care | Payer: 59 | Attending: Emergency Medicine | Admitting: Emergency Medicine

## 2021-06-17 ENCOUNTER — Emergency Department (HOSPITAL_COMMUNITY): Payer: 59

## 2021-06-17 ENCOUNTER — Other Ambulatory Visit: Payer: Self-pay

## 2021-06-17 ENCOUNTER — Encounter (HOSPITAL_COMMUNITY): Payer: Self-pay

## 2021-06-17 DIAGNOSIS — N201 Calculus of ureter: Secondary | ICD-10-CM | POA: Diagnosis not present

## 2021-06-17 DIAGNOSIS — R109 Unspecified abdominal pain: Secondary | ICD-10-CM

## 2021-06-17 LAB — COMPREHENSIVE METABOLIC PANEL
ALT: 26 U/L (ref 0–44)
AST: 27 U/L (ref 15–41)
Albumin: 4.8 g/dL (ref 3.5–5.0)
Alkaline Phosphatase: 60 U/L (ref 38–126)
Anion gap: 8 (ref 5–15)
BUN: 15 mg/dL (ref 6–20)
CO2: 28 mmol/L (ref 22–32)
Calcium: 9.6 mg/dL (ref 8.9–10.3)
Chloride: 104 mmol/L (ref 98–111)
Creatinine, Ser: 1.06 mg/dL (ref 0.61–1.24)
GFR, Estimated: >60 mL/min (ref >60)
Glucose, Bld: 126 mg/dL — ABNORMAL HIGH (ref 70–99)
Potassium: 4.3 mmol/L (ref 3.5–5.1)
Sodium: 140 mmol/L (ref 135–145)
Total Bilirubin: 1.1 mg/dL (ref 0.3–1.2)
Total Protein: 7.6 g/dL (ref 6.5–8.1)

## 2021-06-17 LAB — CBC
HCT: 47 % (ref 39.0–52.0)
Hemoglobin: 15.8 g/dL (ref 13.0–17.0)
MCH: 29.8 pg (ref 26.0–34.0)
MCHC: 33.6 g/dL (ref 30.0–36.0)
MCV: 88.5 fL (ref 80.0–100.0)
Platelets: 241 10*3/uL (ref 150–400)
RBC: 5.31 MIL/uL (ref 4.22–5.81)
RDW: 13.1 % (ref 11.5–15.5)
WBC: 16.6 10*3/uL — ABNORMAL HIGH (ref 4.0–10.5)
nRBC: 0 % (ref 0.0–0.2)

## 2021-06-17 LAB — URINALYSIS, ROUTINE W REFLEX MICROSCOPIC
Bilirubin Urine: NEGATIVE
Glucose, UA: NEGATIVE mg/dL
Ketones, ur: NEGATIVE mg/dL
Leukocytes,Ua: NEGATIVE
Nitrite: NEGATIVE
Protein, ur: NEGATIVE mg/dL
Specific Gravity, Urine: 1.02 (ref 1.005–1.030)
pH: 6 (ref 5.0–8.0)

## 2021-06-17 MED ORDER — OXYCODONE HCL 5 MG PO TABS: 5.0000 mg | ORAL_TABLET | Freq: Four times a day (QID) | ORAL | 0 refills | Status: DC | PRN

## 2021-06-17 MED ORDER — OXYCODONE-ACETAMINOPHEN 5-325 MG PO TABS
1.0000 | ORAL_TABLET | Freq: Once | ORAL | Status: AC
  Administered 2021-06-17: 1 via ORAL
  Filled 2021-06-17: qty 1

## 2021-06-17 MED ORDER — TAMSULOSIN HCL 0.4 MG PO CAPS: 0.4000 mg | ORAL_CAPSULE | Freq: Every day | ORAL | 0 refills | Status: AC

## 2021-06-17 MED ORDER — KETOROLAC TROMETHAMINE 30 MG/ML IJ SOLN
30.0000 mg | Freq: Once | INTRAMUSCULAR | Status: AC
  Administered 2021-06-17: 30 mg via INTRAVENOUS
  Filled 2021-06-17: qty 1

## 2021-06-17 NOTE — Discharge Instructions (Addendum)
Take 5 mg Oxycodone q6h as needed for pain Take 0.4 mg tablet once daily until stone has passed

## 2021-06-17 NOTE — ED Provider Notes (Signed)
Emergency Medicine Provider Triage Evaluation Note  Gregory Dorsey , a 55 y.o. male  was evaluated in triage.  Pt complains of right flank pain that began @ 2PM today. RLQ radiating to R flank, waxing/waning, improved some @ present. Associated N/V & dysuria. Hx of kidney stones but has been several years.   Review of Systems  Positive: Flank pain, N/V, dysuria Negative: Fever, hematuria.   Physical Exam  BP (!) 142/78 (BP Location: Right Arm)   Pulse 88   Temp 98.4 F (36.9 C) (Oral)   Resp 17   SpO2 100%  Gen:   Awake, no distress   Resp:  Normal effort  MSK:   Moves extremities without difficulty  Other:  No focal abdominal tenderness.   Medical Decision Making  Medically screening exam initiated at 6:35 PM.  Appropriate orders placed.  Fausto Skillern was informed that the remainder of the evaluation will be completed by another provider, this initial triage assessment does not replace that evaluation, and the importance of remaining in the ED until their evaluation is complete.  Flank pain.    Leafy Kindle 06/17/21 1836    Charlesetta Shanks, MD 07/06/21 [phone]

## 2021-06-17 NOTE — ED Triage Notes (Signed)
Pt reports right sided flank pain and N/V that began around 2pm today. Pt has hx of kidney stones.

## 2021-06-17 NOTE — ED Provider Notes (Signed)
[facility] DEPT Provider Note   CSN: [id number] Arrival date & time: 06/17/21  1759     History Chief Complaint  Patient presents with   Flank Pain    Gregory Dorsey is a 55 y.o. male with a PMH of nephrolithiasis, GERD, and prediabetes presents to ED with right flank pain. He states that the pain began around 2 pm today. He has intermittent right lower abdominal pain that radiates to the back. He is currently not in pain, but just had an episode. He states he has associated nausea.  He had some urinary retention earlier this afternoon, but has since been able to urinate. Denies hematuria, fever, or chills.  He states that he has has had kidney stones in the past, however, his most recent stone was 7 years ago.       Past Medical History:  Diagnosis Date   Kidney stone    x2   Reflux     Patient Active Problem List   Diagnosis Date Noted   Traumatic tear of supraspinatus tendon of right shoulder 06/22/2019   Family history of early CAD 07/22/2018   High cholesterol 05/29/2016   Erectile dysfunction 05/14/2015   Prediabetes 01/26/2013   GERD (gastroesophageal reflux disease) 01/12/2013   Elevated blood pressure reading in office without diagnosis of hypertension 01/12/2013   NEPHROLITHIASIS, HX OF 02/13/2008    Past Surgical History:  Procedure Laterality Date   WRIST SURGERY Left 29 years ago       Family History  Problem Relation Age of Onset   Arthritis Mother    Heart disease Father 12       CAD   Colon polyps Father    Colon cancer Neg Hx    Esophageal cancer Neg Hx    Rectal cancer Neg Hx    Stomach cancer Neg Hx     Social History   Tobacco Use   Smoking status: Never   Smokeless tobacco: Never  Vaping Use   Vaping Use: Never used  Substance Use Topics   Alcohol use: No   Drug use: No    Home Medications Prior to Admission medications   Medication Sig Start Date End Date Taking? Authorizing Provider   oxyCODONE (OXY IR/ROXICODONE) 5 MG immediate release tablet Take 1 tablet (5 mg total) by mouth every 6 (six) hours as needed for severe pain. 06/17/21  Yes Carlisle Cater, PA-C  tamsulosin (FLOMAX) 0.4 MG CAPS capsule Take 1 capsule (0.4 mg total) by mouth daily for 7 days. 06/17/21 06/24/21 Yes Janellie Tennison, Adora Fridge, PA-C    Allergies    Patient has no known allergies.  Review of Systems   Review of Systems  Constitutional:  Negative for chills and fever.  Respiratory:  Negative for shortness of breath.   Cardiovascular:  Negative for chest pain.  Gastrointestinal:  Positive for abdominal pain and nausea. Negative for constipation, diarrhea and vomiting.  Genitourinary:  Positive for difficulty urinating and flank pain. Negative for decreased urine volume, dysuria, hematuria and urgency.   Physical Exam Updated Vital Signs BP 137/81   Pulse 87   Temp 99.5 F (37.5 C) (Oral)   Resp 17   SpO2 100%   Physical Exam Vitals reviewed.  Constitutional:      General: He is not in acute distress. HENT:     Head: Normocephalic and atraumatic.  Eyes:     Conjunctiva/sclera: Conjunctivae normal.  Pulmonary:     Effort: Pulmonary effort is normal. No respiratory  distress.  Abdominal:     General: Abdomen is flat. Bowel sounds are normal. There is no distension.     Palpations: Abdomen is soft.     Tenderness: There is no abdominal tenderness. There is no right CVA tenderness, left CVA tenderness, guarding or rebound.  Skin:    General: Skin is warm and dry.  Neurological:     Mental Status: He is alert.  Psychiatric:        Mood and Affect: Mood normal.        Thought Content: Thought content normal.    ED Results / Procedures / Treatments   Labs (all labs ordered are listed, but only abnormal results are displayed) Labs Reviewed  CBC - Abnormal; Notable for the following components:      Result Value   WBC 16.6 (*)    All other components within normal limits  URINALYSIS,  ROUTINE W REFLEX MICROSCOPIC - Abnormal; Notable for the following components:   Hgb urine dipstick MODERATE (*)    Bacteria, UA RARE (*)    All other components within normal limits  COMPREHENSIVE METABOLIC PANEL - Abnormal; Notable for the following components:   Glucose, Bld 126 (*)    All other components within normal limits    EKG None  Radiology CT Renal Stone Study  Result Date: 06/17/2021 CLINICAL DATA:  Right flank pain, dysuria EXAM: CT ABDOMEN AND PELVIS WITHOUT CONTRAST TECHNIQUE: Multidetector CT imaging of the abdomen and pelvis was performed following the standard protocol without IV contrast. COMPARISON:  04/12/2013 FINDINGS: Lower chest: Lung bases are clear. Hepatobiliary: Unenhanced liver is unremarkable. Gallbladder is unremarkable. No intrahepatic or extrahepatic ductal dilatation. Pancreas: Within normal limits. Spleen: Within normal limits. Adrenals/Urinary Tract: Adrenal glands are within normal limits. 2 mm nonobstructing left upper pole renal calculus (coronal image 81). Right kidney is within normal limits. Mild right hydroureteronephrosis. 2 mm distal right ureteral calculus at the UVJ (series 2/image 78). Bladder is within normal limits. Stomach/Bowel: Stomach is within normal limits. No evidence of bowel obstruction. Normal appendix (series 2/image 48). Vascular/Lymphatic: No evidence of abdominal aortic aneurysm. Atherosclerotic calcifications of the abdominal aorta and branch vessels. No suspicious abdominopelvic lymphadenopathy. Reproductive: Prostate is unremarkable. Other: No abdominopelvic ascites. Musculoskeletal: Mild degenerative changes of the visualized thoracolumbar spine. IMPRESSION: 2 mm distal right ureteral calculus at the UVJ. Mild right hydroureteronephrosis. Additional 2 mm nonobstructing left upper pole renal calculus. Electronically Signed   By: Julian Hy M.D.   On: 06/17/2021 19:25    Procedures Procedures   Medications Ordered in  ED Medications  ketorolac (TORADOL) 30 MG/ML injection 30 mg (30 mg Intravenous Given 06/17/21 2210)  oxyCODONE-acetaminophen (PERCOCET/ROXICET) 5-325 MG per tablet 1 tablet (1 tablet Oral Given 06/17/21 2259)    ED Course  I have reviewed the triage vital signs and the nursing notes.  Pertinent labs & imaging results that were available during my care of the patient were reviewed by me and considered in my medical decision making (see chart for details).  Clinical Course as of 06/17/21 2309  Tue Jun 17, 2021  2131 CT Joaquim Lai Study Results:  2 mm distal right ureteral calculus at the UVJ. Mild right hydroureteronephrosis.   Additional 2 mm nonobstructing left upper pole renal calculus.   [GL]  2132 Labs notable for mild leukocytosis. CMP unremarkable. UA pending. [GL]  2239 UA results not suggestive of UTI. [GL]    Clinical Course User Index [GL] Kayleah Appleyard, Adora Fridge, PA-C   MDM Rules/Calculators/A&P  Patient presents to ED with colicky right flank and abdominal pain. CT stone study notable for 2 mm distal right ureteral calculus at UVJ with mild right ureteronephrosis. An additional 2 mm nonobstructive left upper pole renal calculus seen. Mild leukocytosis notable on CBC. UA results with some hematuria, however, not suggestive of UTI. Given Toradol IM in ED. Due to GERD contraindication for NSAIDs, will send patient home with 10 day course of Oxycodone for pain control instead of Ibuprofen. Flomax prescribed to assist with stone extraction. One time dose of Percocet given prior to discharge. Patient stable for discharge and given instructions for return precautions.   Final Clinical Impression(s) / ED Diagnoses Final diagnoses:  Ureterolithiasis  Right flank pain    Rx / DC Orders ED Discharge Orders          Ordered    oxyCODONE (ROXICODONE) 5 MG immediate release tablet  Every 6 hours PRN,   Status:  Discontinued        06/17/21 2201    tamsulosin  (FLOMAX) 0.4 MG CAPS capsule  Daily        06/17/21 2220    oxyCODONE (ROXICODONE) 5 MG immediate release tablet  Every 6 hours PRN,   Status:  Discontinued        06/17/21 2236    oxyCODONE (OXY IR/ROXICODONE) 5 MG immediate release tablet  Every 6 hours PRN        06/17/21 2238             Sheila Oats 06/17/21 2309    Lacretia Leigh, MD 06/19/21 1353

## 2021-06-17 NOTE — ED Provider Notes (Signed)
Patient seen in conjunction with Loeffler PA-C.   Patient here with right-sided flank and abdominal pain consistent with kidney stone.  This was confirmed with CT imaging.  Patient has a distal right-sided ureteral stone which is likely the cause of his symptoms.  UA without signs of infection.  Symptoms have been waxing waning, but well controlled after administration of Toradol.  Home with oxycodone, Flomax.  BP 137/81   Pulse 87   Temp 99.5 F (37.5 C) (Oral)   Resp 17   SpO2 100%     Carlisle Cater, Hershal Coria 06/17/21 2307    Lacretia Leigh, MD 06/19/21 1352

## 2021-07-21 ENCOUNTER — Telehealth: Payer: Self-pay | Admitting: Family Medicine

## 2021-07-21 DIAGNOSIS — R7303 Prediabetes: Secondary | ICD-10-CM

## 2021-07-21 DIAGNOSIS — Z125 Encounter for screening for malignant neoplasm of prostate: Secondary | ICD-10-CM

## 2021-07-21 DIAGNOSIS — E7800 Pure hypercholesterolemia, unspecified: Secondary | ICD-10-CM

## 2021-07-21 NOTE — Telephone Encounter (Signed)
-----   Message from Ellamae Sia sent at 07/16/2021 10:47 AM EDT ----- Regarding: Lab orders for Wednesday, 9.28.22 Patient is scheduled for CPX labs, please order future labs, Thanks , Karna Christmas

## 2021-07-30 ENCOUNTER — Other Ambulatory Visit: Payer: 59

## 2021-08-05 ENCOUNTER — Other Ambulatory Visit: Payer: Self-pay

## 2021-08-05 ENCOUNTER — Ambulatory Visit (INDEPENDENT_AMBULATORY_CARE_PROVIDER_SITE_OTHER): Payer: 59 | Admitting: Family Medicine

## 2021-08-05 VITALS — BP 104/72 | HR 82 | Temp 97.6°F | Resp 14 | Ht 71.0 in | Wt 201.0 lb

## 2021-08-05 DIAGNOSIS — E7800 Pure hypercholesterolemia, unspecified: Secondary | ICD-10-CM | POA: Diagnosis not present

## 2021-08-05 DIAGNOSIS — Z0000 Encounter for general adult medical examination without abnormal findings: Secondary | ICD-10-CM

## 2021-08-05 DIAGNOSIS — R7303 Prediabetes: Secondary | ICD-10-CM

## 2021-08-05 DIAGNOSIS — Z125 Encounter for screening for malignant neoplasm of prostate: Secondary | ICD-10-CM

## 2021-08-05 LAB — COMPREHENSIVE METABOLIC PANEL
ALT: 25 U/L (ref 0–53)
AST: 22 U/L (ref 0–37)
Albumin: 4.6 g/dL (ref 3.5–5.2)
Alkaline Phosphatase: 65 U/L (ref 39–117)
BUN: 11 mg/dL (ref 6–23)
CO2: 29 mEq/L (ref 19–32)
Calcium: 9.9 mg/dL (ref 8.4–10.5)
Chloride: 103 mEq/L (ref 96–112)
Creatinine, Ser: 0.95 mg/dL (ref 0.40–1.50)
GFR: 90.31 mL/min (ref >60.00)
Glucose, Bld: 84 mg/dL (ref 70–99)
Potassium: 4.4 mEq/L (ref 3.5–5.1)
Sodium: 140 mEq/L (ref 135–145)
Total Bilirubin: 1.2 mg/dL (ref 0.2–1.2)
Total Protein: 6.8 g/dL (ref 6.0–8.3)

## 2021-08-05 LAB — LIPID PANEL
Cholesterol: 194 mg/dL (ref 0–200)
HDL: 71.4 mg/dL (ref >39.00)
LDL Cholesterol: 110 mg/dL — ABNORMAL HIGH (ref 0–99)
NonHDL: 122.24
Total CHOL/HDL Ratio: 3
Triglycerides: 62 mg/dL (ref 0.0–149.0)
VLDL: 12.4 mg/dL (ref 0.0–40.0)

## 2021-08-05 LAB — HEMOGLOBIN A1C: Hgb A1c MFr Bld: 5.5 % (ref 4.6–6.5)

## 2021-08-05 LAB — PSA: PSA: 1.2 ng/mL (ref 0.10–4.00)

## 2021-08-05 NOTE — Progress Notes (Signed)
Patient ID: Gregory Dorsey, male    DOB: Mar 18, 1966, 55 y.o.   MRN: [id number]  This visit was conducted in person.    CC: Annual physical Subjective:   HPI: Gregory Dorsey is a 55 y.o. male presenting on 08/05/2021 for annual.  Elevated Cholesterol: Family history  of CAD in father age 92s. Due for re-eval. Using medications without problems: Muscle aches:  Diet compliance: moderate Exercise: Physical work on farm Other complaints:  Prediabetes Due for re-eval   BP Readings from Last 3 Encounters:  08/05/21 104/72  06/17/21 137/81  08/01/20 140/86   Wt Readings from Last 3 Encounters:  08/05/21 201 lb (91.2 kg)  08/01/20 211 lb (95.7 kg)  07/25/19 217 lb 4 oz (98.5 kg)   Body mass index is 28.03 kg/m.       Relevant past medical, surgical, family and social history reviewed and updated as indicated. Interim medical history since our last visit reviewed. Allergies and medications reviewed and updated. Outpatient Medications Prior to Visit  Medication Sig Dispense Refill   oxyCODONE (OXY IR/ROXICODONE) 5 MG immediate release tablet Take 1 tablet (5 mg total) by mouth every 6 (six) hours as needed for severe pain. 10 tablet 0   No facility-administered medications prior to visit.     Per HPI unless specifically indicated in ROS section below Review of Systems  Constitutional:  Negative for fatigue and fever.  HENT:  Negative for ear pain.   Eyes:  Negative for pain.  Respiratory:  Negative for cough and shortness of breath.   Cardiovascular:  Negative for chest pain, palpitations and leg swelling.  Gastrointestinal:  Negative for abdominal pain.  Genitourinary:  Negative for dysuria.  Musculoskeletal:  Negative for arthralgias.  Neurological:  Negative for syncope, light-headedness and headaches.  Psychiatric/Behavioral:  Negative for dysphoric mood.   Objective:  There were no vitals taken for this visit.  Wt Readings from Last 3 Encounters:  08/01/20  211 lb (95.7 kg)  07/25/19 217 lb 4 oz (98.5 kg)  06/22/19 215 lb (97.5 kg)      Physical Exam Constitutional:      General: He is not in acute distress.    Appearance: Normal appearance. He is well-developed. He is not ill-appearing or toxic-appearing.  HENT:     Head: Normocephalic and atraumatic.     Right Ear: Hearing, tympanic membrane, ear canal and external ear normal.     Left Ear: Hearing, tympanic membrane, ear canal and external ear normal.     Nose: Nose normal.     Mouth/Throat:     Pharynx: Uvula midline.  Eyes:     General: Lids are normal. Lids are everted, no foreign bodies appreciated.     Conjunctiva/sclera: Conjunctivae normal.     Pupils: Pupils are equal, round, and reactive to light.  Neck:     Thyroid: No thyroid mass or thyromegaly.     Vascular: No carotid bruit.     Trachea: Trachea and phonation normal.  Cardiovascular:     Rate and Rhythm: Normal rate and regular rhythm.     Pulses: Normal pulses.     Heart sounds: S1 normal and S2 normal. No murmur heard.   No gallop.  Pulmonary:     Breath sounds: Normal breath sounds. No wheezing, rhonchi or rales.  Abdominal:     General: Bowel sounds are normal.     Palpations: Abdomen is soft.     Tenderness: There is no abdominal tenderness. There  is no guarding or rebound.     Hernia: No hernia is present.  Musculoskeletal:     Cervical back: Normal range of motion and neck supple.  Lymphadenopathy:     Cervical: No cervical adenopathy.  Skin:    General: Skin is warm and dry.     Findings: No rash.  Neurological:     Mental Status: He is alert.     Cranial Nerves: No cranial nerve deficit.     Sensory: No sensory deficit.     Gait: Gait normal.     Deep Tendon Reflexes: Reflexes are normal and symmetric.  Psychiatric:        Speech: Speech normal.        Behavior: Behavior normal.        Judgment: Judgment normal.      Results for orders placed or performed during the hospital encounter of  06/17/21  CBC  Result Value Ref Range   WBC 16.6 (H) 4.0 - 10.5 K/uL   RBC 5.31 4.22 - 5.81 MIL/uL   Hemoglobin 15.8 13.0 - 17.0 g/dL   HCT 47.0 39.0 - 52.0 %   MCV 88.5 80.0 - 100.0 fL   MCH 29.8 26.0 - 34.0 pg   MCHC 33.6 30.0 - 36.0 g/dL   RDW 13.1 11.5 - 15.5 %   Platelets 241 150 - 400 K/uL   nRBC 0.0 0.0 - 0.2 %  Urinalysis, Routine w reflex microscopic Urine, Clean Catch  Result Value Ref Range   Color, Urine YELLOW YELLOW   APPearance CLEAR CLEAR   Specific Gravity, Urine 1.020 1.005 - 1.030   pH 6.0 5.0 - 8.0   Glucose, UA NEGATIVE NEGATIVE mg/dL   Hgb urine dipstick MODERATE (A) NEGATIVE   Bilirubin Urine NEGATIVE NEGATIVE   Ketones, ur NEGATIVE NEGATIVE mg/dL   Protein, ur NEGATIVE NEGATIVE mg/dL   Nitrite NEGATIVE NEGATIVE   Leukocytes,Ua NEGATIVE NEGATIVE   RBC / HPF 11-20 0 - 5 RBC/hpf   WBC, UA 0-5 0 - 5 WBC/hpf   Bacteria, UA RARE (A) NONE SEEN   Mucus PRESENT   Comprehensive metabolic panel  Result Value Ref Range   Sodium 140 135 - 145 mmol/L   Potassium 4.3 3.5 - 5.1 mmol/L   Chloride 104 98 - 111 mmol/L   CO2 28 22 - 32 mmol/L   Glucose, Bld 126 (H) 70 - 99 mg/dL   BUN 15 6 - 20 mg/dL   Creatinine, Ser 1.06 0.61 - 1.24 mg/dL   Calcium 9.6 8.9 - 10.3 mg/dL   Total Protein 7.6 6.5 - 8.1 g/dL   Albumin 4.8 3.5 - 5.0 g/dL   AST 27 15 - 41 U/L   ALT 26 0 - 44 U/L   Alkaline Phosphatase 60 38 - 126 U/L   Total Bilirubin 1.1 0.3 - 1.2 mg/dL   GFR, Estimated >60 >60 mL/min   Anion gap 8 5 - 15    This visit occurred during the SARS-CoV-2 public health emergency.  Safety protocols were in place, including screening questions prior to the visit, additional usage of staff PPE, and extensive cleaning of exam room while observing appropriate contact time as indicated for disinfecting solutions.   COVID 19 screen:  No recent travel or known exposure to COVID19 The patient denies respiratory symptoms of COVID 19 at this time. The importance of social  distancing was discussed today.   Assessment and Plan The patient's preventative maintenance and recommended screening tests for an annual wellness  exam were reviewed in full today. Brought up to date unless services declined.  Counselled on the importance of diet, exercise, and its role in overall health and mortality. The patient's FH and SH was reviewed, including their home life, tobacco status, and drug and alcohol status.   Vaccines:uptodate  Tdap,   refused flu,  Discussed COVID19 vaccine side effects and benefits. Strongly encouraged the patient to get the vaccine. Questions answered. Encouraged shingrix. No family history of early prostate/colon cancer . Colon: 10/2018 colonoscopy, polyp  Dr. Lyndel Safe, repeat 5 years Not interested in STD testing  Non smoker.  No ETOH use.  Problem List Items Addressed This Visit     High cholesterol    Due for re-eval.      Prediabetes    Due for re-eval.      Other Visit Diagnoses     Routine general medical examination at a health care facility    -  Primary   Prostate cancer screening              Eliezer Lofts, MD

## 2021-08-05 NOTE — Assessment & Plan Note (Signed)
Due for re-eval. 

## 2021-08-05 NOTE — Patient Instructions (Signed)
Please stop at the lab to have labs drawn.  

## 2021-08-11 ENCOUNTER — Telehealth: Payer: Self-pay | Admitting: Family Medicine

## 2021-08-11 NOTE — Telephone Encounter (Signed)
Pt called in requesting a call back to know his labs results [phone]

## 2021-08-12 NOTE — Telephone Encounter (Signed)
Pt was notified today of lab results.

## 2022-05-26 ENCOUNTER — Encounter: Payer: Self-pay | Admitting: Orthopaedic Surgery

## 2022-05-26 ENCOUNTER — Ambulatory Visit: Payer: Self-pay

## 2022-05-26 ENCOUNTER — Ambulatory Visit (INDEPENDENT_AMBULATORY_CARE_PROVIDER_SITE_OTHER): Payer: 59 | Admitting: Orthopaedic Surgery

## 2022-05-26 ENCOUNTER — Ambulatory Visit (INDEPENDENT_AMBULATORY_CARE_PROVIDER_SITE_OTHER): Payer: 59

## 2022-05-26 DIAGNOSIS — M25512 Pain in left shoulder: Secondary | ICD-10-CM | POA: Diagnosis not present

## 2022-05-26 DIAGNOSIS — M25511 Pain in right shoulder: Secondary | ICD-10-CM | POA: Diagnosis not present

## 2022-05-26 DIAGNOSIS — G8929 Other chronic pain: Secondary | ICD-10-CM

## 2022-05-26 NOTE — Progress Notes (Signed)
Office Visit Note   Patient: Gregory Dorsey           Date of Birth: 1966-06-09           MRN: [id number] Visit Date: 05/26/2022              Requested by: Jinny Sanders, MD [place],  [place] [address] PCP: Jinny Sanders, MD   Assessment & Plan: Visit Diagnoses:  1. Chronic right shoulder pain   2. Chronic left shoulder pain     Plan: Impression is right shoulder pain referred from the cervical spine in addition to left shoulder infraspinatus tear.  In regards to the right shoulder, I would like to start him on a steroid taper.  He is not interested in muscle relaxers.  In regards to the left shoulder, would like to go ahead and get an MRI to assess for structural abnormalities.  He will follow-up with [place] once completed.  Call with concerns or questions.  Follow-Up Instructions: Return for after MRI.   Orders:  Orders Placed This Encounter  Procedures   XR Shoulder Left   XR Shoulder Right   No orders of the defined types were placed in this encounter.     Procedures: No procedures performed   Clinical Data: No additional findings.   Subjective: Chief Complaint  Patient presents with   Left Shoulder - Pain    HPI patient is a pleasant 56 year old gentleman who comes in today with chronic bilateral shoulder pain left greater than right.  History of right shoulder supraspinatus rotator cuff repair about 3 years ago.  He has been doing well but his pain has returned after injuring his left shoulder.  The pain he has on the right is actually about the right lateral neck and in the top of the shoulder.  The pain is worse using the right upper extremity.  No history of neck pathology.  He takes occasional over-the-counter pain medication.  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  He has had pain as well as weakness to the left upper extremity.  Symptoms are worse when he is  raising his arms.  He denies any paresthesias to the left upper extremity. Review of Systems as detailed in HPI.  All other reviewed and are negative.   Objective: Vital Signs: There were no vitals taken for this visit.  Physical Exam well-developed well-nourished gentleman in no acute distress.  Alert and oriented x3.  Ortho Exam right shoulder exam reveals full active range of motion in all planes.  Negative empty can test negative cross body adduction.  Full strength.  Cervical spine exam shows no spinous tenderness.  He does have right-sided paraspinous musculature tenderness as well as tenderness along the scapular border.  He does have crepitus with range of motion of the neck.  Left shoulder exam reveals 90 degrees of active forward flexion.  I can passively get him to full forward flexion.  Near full internal and external rotation but he does have significant weakness with resisted external rotation.  No pain or weakness with empty can test.  He is neurovascular intact distally.  Specialty Comments:  No specialty comments available.  Imaging: XR Shoulder Left  Result Date: 05/26/2022 X-rays demonstrate moderate degenerative changes the [employer] joint.  Minimal degenerative changes the glenohumeral joint.  No screw migration humeral head.  XR Shoulder Right  Result Date: 05/26/2022 X-rays demonstrate degenerative changes to the glenohumeral joint    PMFS History: Patient Active Problem List   Diagnosis Date Noted   Traumatic tear of supraspinatus tendon of right shoulder 06/22/2019   Family history of early CAD 07/22/2018   High cholesterol 05/29/2016   Erectile dysfunction 05/14/2015   Prediabetes 01/26/2013   GERD (gastroesophageal reflux disease) 01/12/2013   NEPHROLITHIASIS, HX OF 02/13/2008   Past Medical History:  Diagnosis Date   Kidney stone    x2   Reflux     Family History  Problem Relation Age of Onset   Arthritis Mother    Heart disease Father 70       CAD    Colon polyps Father    Colon cancer Neg Hx    Esophageal cancer Neg Hx    Rectal cancer Neg Hx    Stomach cancer Neg Hx     Past Surgical History:  Procedure Laterality Date   WRIST SURGERY Left 29 years ago   Social History   Occupational History   Not on file  Tobacco Use   Smoking status: Never   Smokeless tobacco: Never  Vaping Use   Vaping Use: Never used  Substance and Sexual Activity   Alcohol use: No   Drug use: No   Sexual activity: Not on file

## 2022-06-01 ENCOUNTER — Telehealth: Payer: Self-pay | Admitting: Orthopaedic Surgery

## 2022-06-01 ENCOUNTER — Other Ambulatory Visit: Payer: Self-pay

## 2022-06-01 DIAGNOSIS — M542 Cervicalgia: Secondary | ICD-10-CM

## 2022-06-01 DIAGNOSIS — G8929 Other chronic pain: Secondary | ICD-10-CM

## 2022-06-01 NOTE — Telephone Encounter (Signed)
Ordered and notified patient

## 2022-06-01 NOTE — Telephone Encounter (Signed)
Patient called asked if he can get an MRI done on his neck and both shoulders? The number to contact patient is [phone]

## 2022-06-01 NOTE — Telephone Encounter (Signed)
sure

## 2022-06-02 ENCOUNTER — Telehealth: Payer: Self-pay | Admitting: Orthopaedic Surgery

## 2022-06-02 NOTE — Telephone Encounter (Signed)
Patient wife called for her husband in regards to getting his MRI scheduled. It is scheduled for 8/6 but she was contacted and told that his insurance had denied it. They are requesting a peer to peer appeal. CB # [phone]

## 2022-06-02 NOTE — Telephone Encounter (Signed)
A peer to peer will be done tomorrow on this pt per Karna Christmas

## 2022-06-03 ENCOUNTER — Telehealth: Payer: Self-pay | Admitting: Physician Assistant

## 2022-06-03 NOTE — Telephone Encounter (Signed)
Spoke with patient. He was not happy at all. He will do a home exercise program and follow up in 5 weeks. We will then try to get approval again for the left shoulder. He wants to only see Dr.Xu going forward.

## 2022-06-03 NOTE — Telephone Encounter (Signed)
Could you let patient know that his insurance is not approving the left shoulder MRI as he has not had PT or a home guided exercise program for his left shoulder.  We can provide him with a HEP and he can come back in 5 weeks (6 weeks from last visit) for recheck and we can reorder the mri of the left shoulder

## 2022-06-07 ENCOUNTER — Other Ambulatory Visit: Payer: 59

## 2022-07-01 ENCOUNTER — Encounter: Payer: Self-pay | Admitting: Orthopaedic Surgery

## 2022-07-01 ENCOUNTER — Ambulatory Visit (INDEPENDENT_AMBULATORY_CARE_PROVIDER_SITE_OTHER): Payer: 59 | Admitting: Orthopaedic Surgery

## 2022-07-01 DIAGNOSIS — M25512 Pain in left shoulder: Secondary | ICD-10-CM

## 2022-07-01 DIAGNOSIS — M25511 Pain in right shoulder: Secondary | ICD-10-CM | POA: Diagnosis not present

## 2022-07-01 DIAGNOSIS — G8929 Other chronic pain: Secondary | ICD-10-CM | POA: Diagnosis not present

## 2022-07-01 DIAGNOSIS — M542 Cervicalgia: Secondary | ICD-10-CM | POA: Diagnosis not present

## 2022-07-01 NOTE — Progress Notes (Signed)
   Office Visit Note   Patient: Gregory Dorsey           Date of Birth: 09-Nov-1965           MRN: [id number] Visit Date: 07/01/2022              Requested by: Jinny Sanders, MD [place],  [place] [id number] PCP: Jinny Sanders, MD   Assessment & Plan: Visit Diagnoses:  1. Chronic right shoulder pain   2. Chronic left shoulder pain   3. Neck pain     Plan: At this point given the lack of improvement from home exercise program we will reorder MR arthrograms of bilateral shoulders as well as a C-spine.  Follow-up after the MRIs.  Follow-Up Instructions: No follow-ups on file.   Orders:  No orders of the defined types were placed in this encounter.  No orders of the defined types were placed in this encounter.     Procedures: No procedures performed   Clinical Data: No additional findings.   Subjective: Chief Complaint  Patient presents with   Left Shoulder - Follow-up    HPI Gregory Dorsey returns today for chronic bilateral shoulder pain and neck pain.  He has been doing home exercises on his shoulders for the last 6 weeks without significant improvement.  He is status post right shoulder rotator cuff repair.  Review of Systems   Objective: Vital Signs: There were no vitals taken for this visit.  Physical Exam  Ortho Exam Examination of bilateral shoulders is unchanged.  Significant atrophy of the supraspinatus and infraspinatus fossa of the left shoulder. Specialty Comments:  No specialty comments available.  Imaging: No results found.   PMFS History: Patient Active Problem List   Diagnosis Date Noted   Traumatic tear of supraspinatus tendon of right shoulder 06/22/2019   Family history of early CAD 07/22/2018   High cholesterol 05/29/2016   Erectile dysfunction 05/14/2015   Prediabetes 01/26/2013   GERD (gastroesophageal reflux disease) 01/12/2013   NEPHROLITHIASIS, HX OF 02/13/2008   Past Medical History:  Diagnosis Date    Kidney stone    x2   Reflux     Family History  Problem Relation Age of Onset   Arthritis Mother    Heart disease Father 52       CAD   Colon polyps Father    Colon cancer Neg Hx    Esophageal cancer Neg Hx    Rectal cancer Neg Hx    Stomach cancer Neg Hx     Past Surgical History:  Procedure Laterality Date   WRIST SURGERY Left 29 years ago   Social History   Occupational History   Not on file  Tobacco Use   Smoking status: Never   Smokeless tobacco: Never  Vaping Use   Vaping Use: Never used  Substance and Sexual Activity   Alcohol use: No   Drug use: No   Sexual activity: Not on file

## 2022-07-07 ENCOUNTER — Ambulatory Visit
Admission: RE | Admit: 2022-07-07 | Discharge: 2022-07-07 | Disposition: A | Discharge status: Home / Self Care | Payer: 59 | Source: Ambulatory Visit | Attending: Orthopaedic Surgery | Admitting: Orthopaedic Surgery

## 2022-07-07 DIAGNOSIS — G8929 Other chronic pain: Secondary | ICD-10-CM

## 2022-07-07 DIAGNOSIS — M542 Cervicalgia: Secondary | ICD-10-CM

## 2022-07-08 NOTE — Progress Notes (Signed)
Needs f/u for MRI.  Thanks.

## 2022-07-10 ENCOUNTER — Ambulatory Visit (INDEPENDENT_AMBULATORY_CARE_PROVIDER_SITE_OTHER): Payer: 59 | Admitting: Orthopaedic Surgery

## 2022-07-10 DIAGNOSIS — M67922 Unspecified disorder of synovium and tendon, left upper arm: Secondary | ICD-10-CM

## 2022-07-10 DIAGNOSIS — M7542 Impingement syndrome of left shoulder: Secondary | ICD-10-CM

## 2022-07-10 DIAGNOSIS — M75122 Complete rotator cuff tear or rupture of left shoulder, not specified as traumatic: Secondary | ICD-10-CM | POA: Diagnosis not present

## 2022-07-10 NOTE — Progress Notes (Signed)
Office Visit Note   Patient: Gregory Dorsey           Date of Birth: 01/19/1966           MRN: [id number] Visit Date: 07/10/2022              Requested by: Jinny Sanders, MD [place],  [place] [address] PCP: Jinny Sanders, MD   Assessment & Plan: Visit Diagnoses:  1. Nontraumatic complete tear of left rotator cuff   2. Impingement syndrome of left shoulder   3. Tendinopathy of left biceps tendon     Plan: MRI of the right shoulder and cervical spine are relatively unremarkable.  The prior rotator cuff repair is intact.  He has some bulging disks with some mild to moderate foraminal narrowing but his symptoms are more consistent with MRI findings of the left shoulder which shows a full-thickness supraspinatus tear with slight retraction.  He has medial dislocation of the biceps tendon.  Unfavorable acromion and significant AC joint arthritis.  Based on these findings I have recommended arthroscopic rotator cuff repair and biceps tenodesis, debridements as indicated.  Questions encouraged and answered.  Risk benefits prognosis reviewed.  We will get him on the schedule as soon as possible.  Follow-Up Instructions: No follow-ups on file.   Orders:  No orders of the defined types were placed in this encounter.  No orders of the defined types were placed in this encounter.     Procedures: No procedures performed   Clinical Data: No additional findings.   Subjective: Chief Complaint  Patient presents with   Left Shoulder - Follow-up    MRI review   Right Shoulder - Follow-up   Neck - Follow-up    HPI Gregory Dorsey returns today to discuss bilateral shoulder MRIs and cervical spine MRI. Review of Systems   Objective: Vital Signs: There were no vitals taken for this visit.  Physical Exam  Ortho Exam Exams are unchanged. Specialty Comments:  No specialty comments available.  Imaging: No results found.   PMFS History: Patient Active Problem  List   Diagnosis Date Noted   Nontraumatic complete tear of left rotator cuff 07/10/2022   Impingement syndrome of left shoulder 07/10/2022   Tendinopathy of left biceps tendon 07/10/2022   Traumatic tear of supraspinatus tendon of right shoulder 06/22/2019   Family history of early CAD 07/22/2018   High cholesterol 05/29/2016   Erectile dysfunction 05/14/2015   Prediabetes 01/26/2013   GERD (gastroesophageal reflux disease) 01/12/2013   NEPHROLITHIASIS, HX OF 02/13/2008   Past Medical History:  Diagnosis Date   Kidney stone    x2   Reflux     Family History  Problem Relation Age of Onset   Arthritis Mother    Heart disease Father 63       CAD   Colon polyps Father    Colon cancer Neg Hx    Esophageal cancer Neg Hx    Rectal cancer Neg Hx    Stomach cancer Neg Hx     Past Surgical History:  Procedure Laterality Date   WRIST SURGERY Left 29 years ago   Social History   Occupational History   Not on file  Tobacco Use   Smoking status: Never   Smokeless tobacco: Never  Vaping Use   Vaping Use: Never used  Substance and Sexual Activity   Alcohol use: No   Drug use: No   Sexual activity: Not on file

## 2022-08-07 ENCOUNTER — Ambulatory Visit (INDEPENDENT_AMBULATORY_CARE_PROVIDER_SITE_OTHER): Payer: 59 | Admitting: Family Medicine

## 2022-08-07 ENCOUNTER — Encounter: Payer: Self-pay | Admitting: Family Medicine

## 2022-08-07 VITALS — BP 118/80 | HR 72 | Temp 97.7°F | Ht 71.25 in | Wt 202.2 lb

## 2022-08-07 DIAGNOSIS — Z0000 Encounter for general adult medical examination without abnormal findings: Secondary | ICD-10-CM

## 2022-08-07 DIAGNOSIS — R7303 Prediabetes: Secondary | ICD-10-CM

## 2022-08-07 DIAGNOSIS — Z125 Encounter for screening for malignant neoplasm of prostate: Secondary | ICD-10-CM

## 2022-08-07 DIAGNOSIS — E7800 Pure hypercholesterolemia, unspecified: Secondary | ICD-10-CM | POA: Diagnosis not present

## 2022-08-07 LAB — COMPREHENSIVE METABOLIC PANEL
ALT: 17 U/L (ref 0–53)
AST: 16 U/L (ref 0–37)
Albumin: 4.8 g/dL (ref 3.5–5.2)
Alkaline Phosphatase: 77 U/L (ref 39–117)
BUN: 17 mg/dL (ref 6–23)
CO2: 27 mEq/L (ref 19–32)
Calcium: 9.8 mg/dL (ref 8.4–10.5)
Chloride: 103 mEq/L (ref 96–112)
Creatinine, Ser: 0.96 mg/dL (ref 0.40–1.50)
GFR: 88.55 mL/min (ref >60.00)
Glucose, Bld: 85 mg/dL (ref 70–99)
Potassium: 4.5 mEq/L (ref 3.5–5.1)
Sodium: 139 mEq/L (ref 135–145)
Total Bilirubin: 0.9 mg/dL (ref 0.2–1.2)
Total Protein: 7.1 g/dL (ref 6.0–8.3)

## 2022-08-07 LAB — LIPID PANEL
Cholesterol: 170 mg/dL (ref 0–200)
HDL: 66.3 mg/dL (ref >39.00)
LDL Cholesterol: 90 mg/dL (ref 0–99)
NonHDL: 104.07
Total CHOL/HDL Ratio: 3
Triglycerides: 72 mg/dL (ref 0.0–149.0)
VLDL: 14.4 mg/dL (ref 0.0–40.0)

## 2022-08-07 LAB — HEMOGLOBIN A1C: Hgb A1c MFr Bld: 5.7 % (ref 4.6–6.5)

## 2022-08-07 LAB — PSA: PSA: 0.92 ng/mL (ref 0.10–4.00)

## 2022-08-07 NOTE — Patient Instructions (Signed)
Please stop at the lab to have labs drawn.  

## 2022-08-07 NOTE — Progress Notes (Signed)
Patient ID: Gregory Dorsey, male    DOB: [birth year], 56 y.o.   MRN: [id number]  This visit was conducted in person.  BP 118/80   Pulse 72   Temp 97.7 F (36.5 C) (Oral)   Ht 5' 11.25" (1.81 m)   Wt 202 lb 4 oz (91.7 kg)   SpO2 95%   BMI 28.01 kg/m    CC:  Chief Complaint  Patient presents with   Annual Exam    Subjective:   HPI: Gregory Dorsey is a 56 y.o. male presenting on 08/07/2022 for Annual Exam   Has upcoming shoulder surgery on left.. Dr. Erlinda Hong.  Elevated Cholesterol:  Due for re-eval. Using medications without problems: none Muscle aches:  none Diet compliance: Exercise: walking 30 daily Other complaints:   Wt Readings from Last 3 Encounters:  08/07/22 202 lb 4 oz (91.7 kg)  08/05/21 201 lb (91.2 kg)  08/01/20 211 lb (95.7 kg)     Prediabetes  Due for re-eval.  [place] Office Visit from 08/07/2022 in [place] at [facility] Total Score 0           Relevant past medical, surgical, family and social history reviewed and updated as indicated. Interim medical history since our last visit reviewed. Allergies and medications reviewed and updated. No outpatient medications prior to visit.   No facility-administered medications prior to visit.     Per HPI unless specifically indicated in ROS section below Review of Systems  Constitutional:  Negative for fatigue and fever.  HENT:  Negative for ear pain.   Eyes:  Negative for pain.  Respiratory:  Negative for cough and shortness of breath.   Cardiovascular:  Negative for chest pain, palpitations and leg swelling.  Gastrointestinal:  Negative for abdominal pain.  Genitourinary:  Negative for dysuria.  Musculoskeletal:  Negative for arthralgias.  Neurological:  Negative for syncope, light-headedness and headaches.  Psychiatric/Behavioral:  Negative for dysphoric mood.    Objective:  BP 118/80   Pulse 72   Temp 97.7 F (36.5 C) (Oral)   Ht 5' 11.25" (1.81 m)   Wt 202 lb 4  oz (91.7 kg)   SpO2 95%   BMI 28.01 kg/m   Wt Readings from Last 3 Encounters:  08/07/22 202 lb 4 oz (91.7 kg)  08/05/21 201 lb (91.2 kg)  08/01/20 211 lb (95.7 kg)      Physical Exam Constitutional:      General: He is not in acute distress.    Appearance: Normal appearance. He is well-developed. He is not ill-appearing or toxic-appearing.  HENT:     Head: Normocephalic and atraumatic.     Right Ear: Hearing, tympanic membrane, ear canal and external ear normal.     Left Ear: Hearing, tympanic membrane, ear canal and external ear normal.     Nose: Nose normal.     Mouth/Throat:     Pharynx: Uvula midline.  Eyes:     General: Lids are normal. Lids are everted, no foreign bodies appreciated.     Conjunctiva/sclera: Conjunctivae normal.     Pupils: Pupils are equal, round, and reactive to light.  Neck:     Thyroid: No thyroid mass or thyromegaly.     Vascular: No carotid bruit.     Trachea: Trachea and phonation normal.  Cardiovascular:     Rate and Rhythm: Normal rate and regular rhythm.     Pulses: Normal pulses.     Heart sounds: S1 normal and  S2 normal. No murmur heard.    No gallop.  Pulmonary:     Breath sounds: Normal breath sounds. No wheezing, rhonchi or rales.  Abdominal:     General: Bowel sounds are normal.     Palpations: Abdomen is soft.     Tenderness: There is no abdominal tenderness. There is no guarding or rebound.     Hernia: No hernia is present.  Musculoskeletal:     Cervical back: Normal range of motion and neck supple.  Lymphadenopathy:     Cervical: No cervical adenopathy.  Skin:    General: Skin is warm and dry.     Findings: No rash.  Neurological:     Mental Status: He is alert.     Cranial Nerves: No cranial nerve deficit.     Sensory: No sensory deficit.     Gait: Gait normal.     Deep Tendon Reflexes: Reflexes are normal and symmetric.  Psychiatric:        Speech: Speech normal.        Behavior: Behavior normal.        Judgment:  Judgment normal.       Results for orders placed or performed in visit on 08/05/21  PSA  Result Value Ref Range   PSA 1.20 0.10 - 4.00 ng/mL  Comprehensive metabolic panel  Result Value Ref Range   Sodium 140 135 - 145 mEq/L   Potassium 4.4 3.5 - 5.1 mEq/L   Chloride 103 96 - 112 mEq/L   CO2 29 19 - 32 mEq/L   Glucose, Bld 84 70 - 99 mg/dL   BUN 11 6 - 23 mg/dL   Creatinine, Ser 0.95 0.40 - 1.50 mg/dL   Total Bilirubin 1.2 0.2 - 1.2 mg/dL   Alkaline Phosphatase 65 39 - 117 U/L   AST 22 0 - 37 U/L   ALT 25 0 - 53 U/L   Total Protein 6.8 6.0 - 8.3 g/dL   Albumin 4.6 3.5 - 5.2 g/dL   GFR 90.31 >60.00 mL/min   Calcium 9.9 8.4 - 10.5 mg/dL  Lipid panel  Result Value Ref Range   Cholesterol 194 0 - 200 mg/dL   Triglycerides 62.0 0.0 - 149.0 mg/dL   HDL 71.40 >39.00 mg/dL   VLDL 12.4 0.0 - 40.0 mg/dL   LDL Cholesterol 110 (H) 0 - 99 mg/dL   Total CHOL/HDL Ratio 3    NonHDL 122.24   Hemoglobin A1c  Result Value Ref Range   Hgb A1c MFr Bld 5.5 4.6 - 6.5 %     COVID 19 screen:  No recent travel or known exposure to COVID19 The patient denies respiratory symptoms of COVID 19 at this time. The importance of social distancing was discussed today.   Assessment and Plan   The patient's preventative maintenance and recommended screening tests for an annual wellness exam were reviewed in full today. Brought up to date unless services declined.  Counselled on the importance of diet, exercise, and its role in overall health and mortality. The patient's FH and SH was reviewed, including their home life, tobacco status, and drug and alcohol status.   Vaccines:uptodate  Tdap,   refused flu,  Discussed COVID19 vaccine side effects and benefits. Strongly encouraged the patient to get the vaccine. Questions answered. Encouraged shingrix. No family history of early prostate/colon cancer . Colon: 10/2018 colonoscopy, polyp  Dr. Lyndel Safe, repeat 5 years Not interested in STD testing  Non  smoker.  No ETOH use.  Problem List  Items Addressed This Visit     High cholesterol   Relevant Orders   Lipid panel   Comprehensive metabolic panel   Prediabetes   Relevant Orders   Hemoglobin A1c   Other Visit Diagnoses     Routine general medical examination at a health care facility    -  Primary   Prostate cancer screening       Relevant Orders   PSA        Eliezer Lofts, MD

## 2022-09-11 ENCOUNTER — Other Ambulatory Visit: Payer: Self-pay | Admitting: Physician Assistant

## 2022-09-11 MED ORDER — OXYCODONE-ACETAMINOPHEN 5-325 MG PO TABS: 1.0000 | ORAL_TABLET | Freq: Four times a day (QID) | ORAL | 0 refills | Status: DC | PRN

## 2022-09-11 MED ORDER — ONDANSETRON HCL 4 MG PO TABS: 4.0000 mg | ORAL_TABLET | Freq: Three times a day (TID) | ORAL | 0 refills | Status: DC | PRN

## 2022-09-17 DIAGNOSIS — M19012 Primary osteoarthritis, left shoulder: Secondary | ICD-10-CM

## 2022-09-17 DIAGNOSIS — M24112 Other articular cartilage disorders, left shoulder: Secondary | ICD-10-CM

## 2022-09-17 DIAGNOSIS — M7522 Bicipital tendinitis, left shoulder: Secondary | ICD-10-CM

## 2022-09-17 DIAGNOSIS — S46012A Strain of muscle(s) and tendon(s) of the rotator cuff of left shoulder, initial encounter: Secondary | ICD-10-CM

## 2022-09-17 DIAGNOSIS — M7542 Impingement syndrome of left shoulder: Secondary | ICD-10-CM

## 2022-09-18 ENCOUNTER — Encounter: Payer: Self-pay | Admitting: Orthopaedic Surgery

## 2022-09-18 ENCOUNTER — Telehealth: Payer: Self-pay | Admitting: Orthopaedic Surgery

## 2022-09-18 NOTE — Telephone Encounter (Signed)
Pt wife called wondering if her husband is suppose to wear the sling the whole time or until nerve black wears off?   Cb [phone]

## 2022-09-18 NOTE — Telephone Encounter (Signed)
Can wear just until nerve block wears off.  Should not lift more than 5 lbs.

## 2022-09-21 NOTE — Telephone Encounter (Signed)
Called and spoke with patient. He spoke with Dr.Xu yesterday and received the information.

## 2022-09-23 ENCOUNTER — Ambulatory Visit (INDEPENDENT_AMBULATORY_CARE_PROVIDER_SITE_OTHER): Payer: 59 | Admitting: Orthopaedic Surgery

## 2022-09-23 DIAGNOSIS — M75122 Complete rotator cuff tear or rupture of left shoulder, not specified as traumatic: Secondary | ICD-10-CM

## 2022-09-23 DIAGNOSIS — M7542 Impingement syndrome of left shoulder: Secondary | ICD-10-CM

## 2022-09-23 NOTE — Progress Notes (Signed)
   Post-Op Visit Note   Patient: Gregory Dorsey           Date of Birth: 1966-01-12           MRN: [id number] Visit Date: 09/23/2022 PCP: Gregory Sanders, MD   Assessment & Plan:  Chief Complaint: No chief complaint on file.  Visit Diagnoses:  1. Impingement syndrome of left shoulder   2. Nontraumatic complete tear of left rotator cuff     Plan: Gregory Dorsey returns today for his first postoperative visit.  He reports soreness and tenderness to palpation around the shoulder.  Examination of the shoulder shows healed surgical incisions without any signs of infection.  Axillary nerve intact.  He has weakness to forward flexion and abduction.  Arthroscopy photos were reviewed with the patient.  Activity restrictions were reviewed.  I will make a referral for outpatient PT.  The portal sutures were removed.  We will have him come back next week for suture removal from the larger incision.  Follow-Up Instructions: No follow-ups on file.   Orders:  Orders Placed This Encounter  Procedures   Ambulatory referral to Physical Therapy   No orders of the defined types were placed in this encounter.   Imaging: No results found.  PMFS History: Patient Active Problem List   Diagnosis Date Noted   Nontraumatic complete tear of left rotator cuff 07/10/2022   Impingement syndrome of left shoulder 07/10/2022   Tendinopathy of left biceps tendon 07/10/2022   Traumatic tear of supraspinatus tendon of right shoulder 06/22/2019   Family history of early CAD 07/22/2018   High cholesterol 05/29/2016   Erectile dysfunction 05/14/2015   Prediabetes 01/26/2013   GERD (gastroesophageal reflux disease) 01/12/2013   NEPHROLITHIASIS, HX OF 02/13/2008   Past Medical History:  Diagnosis Date   Kidney stone    x2   Reflux     Family History  Problem Relation Age of Onset   Arthritis Mother    Heart disease Father 10       CAD   Colon polyps Father    Colon cancer Neg Hx    Esophageal cancer  Neg Hx    Rectal cancer Neg Hx    Stomach cancer Neg Hx     Past Surgical History:  Procedure Laterality Date   WRIST SURGERY Left 29 years ago   Social History   Occupational History   Not on file  Tobacco Use   Smoking status: Never   Smokeless tobacco: Never  Vaping Use   Vaping Use: Never used  Substance and Sexual Activity   Alcohol use: No   Drug use: No   Sexual activity: Not on file

## 2022-10-01 ENCOUNTER — Ambulatory Visit (INDEPENDENT_AMBULATORY_CARE_PROVIDER_SITE_OTHER): Payer: 59 | Admitting: Physician Assistant

## 2022-10-01 DIAGNOSIS — G8929 Other chronic pain: Secondary | ICD-10-CM

## 2022-10-01 DIAGNOSIS — M25512 Pain in left shoulder: Secondary | ICD-10-CM

## 2022-10-01 NOTE — Progress Notes (Signed)
   Post-Op Visit Note   Patient: Gregory Dorsey           Date of Birth: January 07, 1966           MRN: [id number] Visit Date: 10/01/2022 PCP: Jinny Sanders, MD   Assessment & Plan:  Chief Complaint:  Chief Complaint  Patient presents with   Left Shoulder - Routine Post Op   Visit Diagnoses:  1. Chronic left shoulder pain     Plan: Patient is a pleasant 56 year old gentleman who comes in today 2 weeks status post left shoulder arthroscopic decompression and open biceps tenodesis.  He has been doing well.  He is not taking anything for pain.  Examination of his left shoulder reveals a well-healed surgical incision without evidence of infection or cellulitis.  Fingers warm well perfused.  Today, sutures were removed and Steri-Strips applied.  No lifting greater than 2 pounds for another 4 weeks.  He is scheduled to start physical therapy next week.  He will follow-up with [place] in 4 weeks for recheck.  Call with concerns or questions.  Follow-Up Instructions: Return in about 4 weeks (around 10/29/2022).   Orders:  No orders of the defined types were placed in this encounter.  No orders of the defined types were placed in this encounter.   Imaging: No new imaging  PMFS History: Patient Active Problem List   Diagnosis Date Noted   Nontraumatic complete tear of left rotator cuff 07/10/2022   Impingement syndrome of left shoulder 07/10/2022   Tendinopathy of left biceps tendon 07/10/2022   Traumatic tear of supraspinatus tendon of right shoulder 06/22/2019   Family history of early CAD 07/22/2018   High cholesterol 05/29/2016   Erectile dysfunction 05/14/2015   Prediabetes 01/26/2013   GERD (gastroesophageal reflux disease) 01/12/2013   NEPHROLITHIASIS, HX OF 02/13/2008   Past Medical History:  Diagnosis Date   Kidney stone    x2   Reflux     Family History  Problem Relation Age of Onset   Arthritis Mother    Heart disease Father 31       CAD   Colon polyps Father     Colon cancer Neg Hx    Esophageal cancer Neg Hx    Rectal cancer Neg Hx    Stomach cancer Neg Hx     Past Surgical History:  Procedure Laterality Date   WRIST SURGERY Left 29 years ago   Social History   Occupational History   Not on file  Tobacco Use   Smoking status: Never   Smokeless tobacco: Never  Vaping Use   Vaping Use: Never used  Substance and Sexual Activity   Alcohol use: No   Drug use: No   Sexual activity: Not on file

## 2022-10-02 ENCOUNTER — Other Ambulatory Visit: Payer: Self-pay | Admitting: Surgical

## 2022-10-02 ENCOUNTER — Telehealth: Payer: Self-pay | Admitting: Orthopaedic Surgery

## 2022-10-02 MED ORDER — METHOCARBAMOL 500 MG PO TABS: 500.0000 mg | ORAL_TABLET | Freq: Three times a day (TID) | ORAL | 1 refills | Status: DC | PRN

## 2022-10-02 NOTE — Telephone Encounter (Signed)
Patient's wife called. Says Dr. Erlinda Hong did not send in the muscle relaxer. Would like that called in for him.

## 2022-10-02 NOTE — Telephone Encounter (Signed)
Sent in

## 2022-10-05 NOTE — Therapy (Signed)
OUTPATIENT PHYSICAL THERAPY SHOULDER EVALUATION Cervical Evaluation   Patient Name: Gregory Dorsey MRN: [id number] DOB:27-Oct-1966, 56 y.o., male Today's Date: 10/06/2022  END OF SESSION:  PT End of Session - 10/06/22 0851     Visit Number 1    Number of Visits 16    Date for PT Re-Evaluation 12/04/22    Progress Note Due on Visit 10    PT Start Time 0845    PT Stop Time 0925    PT Time Calculation (min) 40 min    Activity Tolerance Patient tolerated treatment well    Behavior During Therapy [facility] for tasks assessed/performed             Past Medical History:  Diagnosis Date   Kidney stone    x2   Reflux    Past Surgical History:  Procedure Laterality Date   WRIST SURGERY Left 29 years ago   Patient Active Problem List   Diagnosis Date Noted   Nontraumatic complete tear of left rotator cuff 07/10/2022   Impingement syndrome of left shoulder 07/10/2022   Tendinopathy of left biceps tendon 07/10/2022   Traumatic tear of supraspinatus tendon of right shoulder 06/22/2019   Family history of early CAD 07/22/2018   High cholesterol 05/29/2016   Erectile dysfunction 05/14/2015   Prediabetes 01/26/2013   GERD (gastroesophageal reflux disease) 01/12/2013   NEPHROLITHIASIS, HX OF 02/13/2008    PCP:  Jinny Sanders, MD   REFERRING PROVIDER: Frankey Shown MD  REFERRING DIAG: M75.42 (ICD-10-CM) - Impingement syndrome of left shoulder M75.122 (ICD-10-CM) - Nontraumatic complete tear of left rotator cuff  THERAPY DIAG:  Acute pain of left shoulder  Stiffness of left shoulder, not elsewhere classified  Facial weakness  Localized edema  Cervicalgia  Rationale for Evaluation and Treatment: Rehabilitation  ONSET DATE: 09/17/22   SUBJECTIVE:  SUBJECTIVE STATEMENT: Pt arriving to therapy reporting 1 year history of left shoulder pain. Pt s/p left shoulder scope, RCR, Biceps tenodesis, SAD, Extensive debridement  PERTINENT HISTORY:  LEFT SHOULDER SCOPE, RCR, BICEPS TENODESIS, SAD, EXTENSIVE DEBRIDEMENT 09/17/22    Kidney stones, reflux, wrist surgery  PAIN:  NPRS scale: no pain at rest   PRECAUTIONS: Shoulder  WEIGHT BEARING RESTRICTIONS: No  FALLS:  Has patient fallen in last 6 months? No  LIVING ENVIRONMENT: Lives with: lives with their family and lives with their spouse Lives in: House/apartment Stairs: Yes: External: 3 steps; none Has following equipment at home: None  OCCUPATION:   Owns a farm  PLOF: Independent  PATIENT GOALS: Stop hurting     OBJECTIVE:   DIAGNOSTIC FINDINGS: 07/10/22  MRI of the right shoulder and cervical spine are relatively unremarkable.  The prior rotator cuff repair is intact.  He has some bulging disks with some mild to moderate foraminal narrowing but his symptoms are more consistent with MRI findings of the left shoulder which shows a full-thickness supraspinatus tear with slight retraction.  He has medial dislocation of the biceps tendon.  Unfavorable acromion and significant AC joint arthritis.  Based on these findings I have recommended arthroscopic rotator cuff repair and biceps tenodesis, debridements as indicated.   PATIENT SURVEYS:  10/06/22: FOTO intake:  37%   COGNITION: Overall cognitive status: WFL     SENSATION: WFL  POSTURE: Forwrad head and rounded shoulder  UPPER EXTREMITY ROM:   ROM Right 10/06/22 Active supine Left 10/06/22 Passive supine  Shoulder flexion 160 122  Shoulder extension 162 20  Shoulder abduction  86  Shoulder adduction    Shoulder internal rotation 64 Shoulder abd 45 deg 52 Shoulder abd 45 deg  Shoulder  external rotation 75 60  Elbow flexion 126 125  Elbow extension 0 0  Wrist flexion    Wrist extension    Wrist ulnar deviation    Wrist radial deviation    Wrist pronation    Wrist supination    (Blank rows = not tested)  CERVICAL active ROM   Cervical flexion: 25 deg Cervical extension; 18 deg with discomfort noted Cervical rotation left:  54 deg Cervical rotation right:  56 deg Cervical side bending left: 22 deg Cervical side bending right:  32 deg   UPPER EXTREMITY MMT:  MMT Right 10/06/22 Left 10/06/22  Shoulder flexion 5/5   Shoulder extension 5/5   Shoulder abduction 5/5   Shoulder adduction    Shoulder internal rotation 5/5   Shoulder external rotation 5/5   Middle trapezius    Lower trapezius    Elbow flexion    Elbow extension    Wrist flexion    Wrist extension    Wrist ulnar deviation    Wrist radial deviation    Wrist pronation    Wrist supination    Grip strength (lbs)    (Blank rows = not tested)     PALPATION:  TTP, anterior shoulder, around incision site   TODAY'S TREATMENT:  DATE: 10/06/22:  Therex:  HEP instruction/performance c cues for techniques, handout provided.  Trial set performed of each for comprehension and symptom assessment.  See below for exercise list   PATIENT EDUCATION: Education details: HEP, POC Person educated: Patient Education method: Explanation, Demonstration, Verbal cues, and Handouts Education comprehension: verbalized understanding, returned demonstration, and verbal cues required  HOME EXERCISE PROGRAM: Access Code: [email or web address] URL: [email or web address] Date: 10/06/2022 Prepared by: Kearney Hard  Exercises - Circular Shoulder Pendulum with Table Support  - 3 x daily - 7 x weekly - Horizontal Shoulder Pendulum with Table Support  - 3 x daily - 7 x weekly - Seated Shoulder  Flexion Towel Slide at Table Top Full Range of Motion  - 3 x daily - 7 x weekly - 2 sets - 10 reps - 5 seconds hold - Seated Shoulder Scaption Slide at Table Top with Forearm in Neutral  - 3 x daily - 7 x weekly - 2 sets - 10 reps - 5 seconds hold - Seated Shoulder External Rotation PROM on Table  - 3 x daily - 7 x weekly - 2 sets - 10 reps - 5 seconds hold - Seated Upper Trapezius Stretch  - 2-3 x daily - 7 x weekly - 3 reps - 10 sec hold  ASSESSMENT:  CLINICAL IMPRESSION: Patient is a 56 y.o. who comes to clinic with complaints of left shoulder s/p RCR, biceps tenodesis with SAD. Pt presents c pain with mobility, strength and movement coordination deficits that impair their ability to perform usual daily and recreational functional activities without increase difficulty/symptoms at this time.  Patient to benefit from skilled PT services to address impairments and limitations to improve to previous level of function without restriction secondary to condition.   OBJECTIVE IMPAIRMENTS: decreased activity tolerance, decreased mobility, decreased ROM, decreased strength, impaired UE functional use, and pain.   ACTIVITY LIMITATIONS: carrying, lifting, sleeping, and reach over head  PARTICIPATION LIMITATIONS: occupation and yard work  PERSONAL FACTORS:  see pertinent medical   are also affecting patient's functional outcome.   REHAB POTENTIAL: Good  CLINICAL DECISION MAKING: Stable/uncomplicated  EVALUATION COMPLEXITY: Low   GOALS: Goals reviewed with patient? Yes  SHORT TERM GOALS: (target date for Short term goals are 3 weeks 10/30/22)  1.Patient will demonstrate independent use of home exercise program to maintain progress from in clinic treatments. Goal status: New  LONG TERM GOALS: (target dates for all long term goals are 10 weeks  12/18/2022)   1. Patient will demonstrate/report pain at worst less than or equal to 2/10 to facilitate minimal limitation in daily activity secondary  to pain symptoms. Goal status: New   2. Patient will demonstrate independent use of home exercise program to facilitate ability to maintain/progress functional gains from skilled physical therapy services. Goal status: New   3. Patient will demonstrate FOTO outcome > or = 66 % to indicate reduced disability due to condition. Goal status: New   4.  Patient will demonstrate left UE MMT 5/5 throughout to facilitate lifting, reaching, carrying at [facility] in daily activity.   Goal status: New   5.  Patient will demonstrate left shoulder flexion >/= 150 degrees without symptoms to facilitate usual overhead reaching, self care, dressing at PLOF.    Goal status: New   6.  Pt will improve left shoulder ER to >/= 65 degrees in order to facility ADL's.   Goal status: New   7. Pt will improve bilateral cervical rotation to >/= 65 degrees for improvements  in functional mobility and activities of daily living.   Goal status: New     PLAN:  PT FREQUENCY: 1-2x/week  PT DURATION: 10 weeks  PLANNED INTERVENTIONS: Therapeutic exercises, Therapeutic activity, Neuro Muscular re-education, Balance training, Gait training, Patient/Family education, Joint mobilization, Stair training, DME instructions, Dry Needling, Electrical stimulation, Traction, Cryotherapy, vasopneumatic device Moist heat, Taping, Ultrasound, Ionotophoresis '4mg'$ /ml Dexamethasone, and Manual therapy.  All included unless contraindicated  PLAN FOR NEXT SESSION: Review HEP knowledge/results, shoulder ROM, manual therapy as needed, vasopneumatic, assess cervical spine ROM next visit.           Oretha Caprice, PT, MPT 10/06/2022, 8:53 AM

## 2022-10-06 ENCOUNTER — Encounter: Payer: Self-pay | Admitting: Physical Therapy

## 2022-10-06 ENCOUNTER — Ambulatory Visit (INDEPENDENT_AMBULATORY_CARE_PROVIDER_SITE_OTHER): Payer: 59 | Admitting: Physical Therapy

## 2022-10-06 DIAGNOSIS — R29810 Facial weakness: Secondary | ICD-10-CM

## 2022-10-06 DIAGNOSIS — M25512 Pain in left shoulder: Secondary | ICD-10-CM

## 2022-10-06 DIAGNOSIS — M25612 Stiffness of left shoulder, not elsewhere classified: Secondary | ICD-10-CM

## 2022-10-06 DIAGNOSIS — M542 Cervicalgia: Secondary | ICD-10-CM

## 2022-10-06 DIAGNOSIS — R600 Localized edema: Secondary | ICD-10-CM | POA: Diagnosis not present

## 2022-10-19 ENCOUNTER — Encounter: Payer: Self-pay | Admitting: Physical Therapy

## 2022-10-19 ENCOUNTER — Ambulatory Visit (INDEPENDENT_AMBULATORY_CARE_PROVIDER_SITE_OTHER): Payer: 59 | Admitting: Physical Therapy

## 2022-10-19 DIAGNOSIS — M25512 Pain in left shoulder: Secondary | ICD-10-CM

## 2022-10-19 DIAGNOSIS — M25612 Stiffness of left shoulder, not elsewhere classified: Secondary | ICD-10-CM | POA: Diagnosis not present

## 2022-10-19 NOTE — Therapy (Signed)
OUTPATIENT PHYSICAL THERAPY TREATMENT NOTE   Patient Name: Gregory Dorsey MRN: [id number] DOB:25-Mar-1966, 56 y.o., male Today's Date: 10/19/2022  END OF SESSION:   PT End of Session - 10/19/22 0805     Visit Number 2    Number of Visits 16    Date for PT Re-Evaluation 12/04/22    Progress Note Due on Visit 10    PT Start Time 0800    PT Stop Time 0840    PT Time Calculation (min) 40 min    Activity Tolerance Patient tolerated treatment well    Behavior During Therapy [facility] for tasks assessed/performed             Past Medical History:  Diagnosis Date   Kidney stone    x2   Reflux    Past Surgical History:  Procedure Laterality Date   WRIST SURGERY Left 29 years ago   Patient Active Problem List   Diagnosis Date Noted   Nontraumatic complete tear of left rotator cuff 07/10/2022   Impingement syndrome of left shoulder 07/10/2022   Tendinopathy of left biceps tendon 07/10/2022   Traumatic tear of supraspinatus tendon of right shoulder 06/22/2019   Family history of early CAD 07/22/2018   High cholesterol 05/29/2016   Erectile dysfunction 05/14/2015   Prediabetes 01/26/2013   GERD (gastroesophageal reflux disease) 01/12/2013   NEPHROLITHIASIS, HX OF 02/13/2008     THERAPY DIAG:  Acute pain of left shoulder  Stiffness of left shoulder, not elsewhere classified   PCP:  Jinny Sanders, MD   REFERRING PROVIDER: Frankey Shown MD  REFERRING DIAG: M75.42 (ICD-10-CM) - Impingement syndrome of left shoulder M75.122 (ICD-10-CM) - Nontraumatic complete tear of left rotator cuff  THERAPY DIAG:  Acute pain of left shoulder  Stiffness of left shoulder, not elsewhere classified  Facial weakness  Localized edema  Cervicalgia  Rationale for Evaluation and Treatment: Rehabilitation  ONSET DATE: 09/17/22   SUBJECTIVE:  SUBJECTIVE STATEMENT: Doing well, reports exercises going well.  Still working on the farm.  PERTINENT HISTORY:  LEFT SHOULDER SCOPE, RCR, BICEPS TENODESIS, SAD, EXTENSIVE DEBRIDEMENT 09/17/22    Kidney stones, reflux, wrist surgery  PAIN:  NPRS scale: no pain at rest   PRECAUTIONS: Shoulder  WEIGHT BEARING RESTRICTIONS: No  FALLS:  Has patient fallen in last 6 months? No  LIVING ENVIRONMENT: Lives with: lives with their family and lives with their spouse Lives in: House/apartment Stairs: Yes: External: 3 steps; none Has following equipment at home: None  OCCUPATION:   Owns a farm  PLOF: Independent  PATIENT GOALS: Stop hurting     OBJECTIVE:   DIAGNOSTIC FINDINGS: 07/10/22  MRI of the right shoulder and cervical spine are relatively unremarkable.  The prior rotator cuff repair is intact.  He has some bulging disks with some mild to moderate foraminal narrowing but his symptoms are more consistent with MRI findings of the left shoulder which shows a full-thickness supraspinatus tear with slight retraction.  He has medial dislocation of the biceps tendon.  Unfavorable acromion and significant AC joint arthritis.  Based on these findings I have recommended arthroscopic rotator cuff repair and biceps tenodesis, debridements as indicated.   PATIENT SURVEYS:  10/06/22: FOTO intake:  37%   POSTURE: Forward head and rounded shoulder  UPPER EXTREMITY ROM:   ROM Right 10/06/22 Active supine Left 10/06/22 Passive supine Left 10/19/22 Passive  supine  Shoulder flexion 160 122 140  Shoulder extension  20   Shoulder abduction 162 86   Shoulder adduction     Shoulder internal rotation 64 Shoulder abd 45 deg 52 Shoulder abd 45 deg   Shoulder external rotation 75 60  73  (50 deg abdct)  Elbow flexion 126 125   Elbow extension 0 0   Wrist flexion     Wrist extension     Wrist ulnar deviation     Wrist radial deviation     Wrist pronation     Wrist supination     (Blank rows = not tested)  CERVICAL active ROM   Cervical flexion: 25 deg Cervical extension; 18 deg with discomfort noted Cervical rotation left:  54 deg Cervical rotation right:  56 deg Cervical side bending left: 22 deg Cervical side bending right:  32 deg   UPPER EXTREMITY MMT:  MMT Right 10/06/22 Left 10/06/22  Shoulder flexion 5/5   Shoulder extension 5/5   Shoulder abduction 5/5   Shoulder adduction    Shoulder internal rotation 5/5   Shoulder external rotation 5/5   Middle trapezius    Lower trapezius    Elbow flexion    Elbow extension    Wrist flexion    Wrist extension    Wrist ulnar deviation    Wrist radial deviation    Wrist pronation    Wrist supination    Grip strength (lbs)    (Blank rows = not tested)     PALPATION:  TTP, anterior shoulder, around incision site   TODAY'S TREATMENT:  DATE: 10/19/22 TherEx Pulleys flexion and scaption x 3 min each Supine AA flexion 2# bar 2x10 Supine AA ER 2# bar 2x10  Seated scapular retraction x10 reps; 5 sec hold Standing Lt shoulder flexion isometrics 5 sec hold; 2x10 Standing Lt shoulder abduction isometrics 5 sec hold; 2x10  10/06/22:  Therex:  HEP instruction/performance c cues for techniques, handout provided.  Trial set performed of each for comprehension and symptom assessment.  See below for exercise list   PATIENT EDUCATION: Education details: HEP, POC Person educated: Patient Education method: Explanation, Demonstration, Verbal cues, and Handouts Education comprehension: verbalized understanding, returned demonstration, and verbal cues required  HOME EXERCISE  PROGRAM: Access Code: [email or web address] URL: [email or web address] Date: 10/06/2022 Prepared by: Kearney Hard  Exercises - Circular Shoulder Pendulum with Table Support  - 3 x daily - 7 x weekly - Horizontal Shoulder Pendulum with Table Support  - 3 x daily - 7 x weekly - Seated Shoulder Flexion Towel Slide at Table Top Full Range of Motion  - 3 x daily - 7 x weekly - 2 sets - 10 reps - 5 seconds hold - Seated Shoulder Scaption Slide at Table Top with Forearm in Neutral  - 3 x daily - 7 x weekly - 2 sets - 10 reps - 5 seconds hold - Seated Shoulder External Rotation PROM on Table  - 3 x daily - 7 x weekly - 2 sets - 10 reps - 5 seconds hold - Seated Upper Trapezius Stretch  - 2-3 x daily - 7 x weekly - 3 reps - 10 sec hold  ASSESSMENT:  CLINICAL IMPRESSION: Pt tolerated session well today with focus on initiating deltoid activation.  Unable to actively perform supine shoulder flexion at this time.  PROM improved today.  Will continue to benefit from PT to maximize function.  OBJECTIVE IMPAIRMENTS: decreased activity tolerance, decreased mobility, decreased ROM, decreased strength, impaired UE functional use, and pain.   ACTIVITY LIMITATIONS: carrying, lifting, sleeping, and reach over head  PARTICIPATION LIMITATIONS: occupation and yard work  PERSONAL FACTORS: see pertinent medical  are also affecting patient's functional outcome.   REHAB POTENTIAL: Good  CLINICAL DECISION MAKING: Stable/uncomplicated  EVALUATION COMPLEXITY: Low   GOALS: Goals reviewed with patient? Yes  SHORT TERM GOALS: (target date for Short term goals are 3 weeks 10/30/22)  1.Patient will demonstrate independent use of home exercise program to maintain progress from in clinic treatments. Goal status: New  LONG TERM GOALS: (target dates for all long term goals are 10 weeks  12/18/2022)   1. Patient will demonstrate/report pain at worst less than or equal to 2/10 to facilitate minimal limitation  in daily activity secondary to pain symptoms. Goal status: New   2. Patient will demonstrate independent use of home exercise program to facilitate ability to maintain/progress functional gains from skilled physical therapy services. Goal status: New   3. Patient will demonstrate FOTO outcome > or = 66 % to indicate reduced disability due to condition. Goal status: New   4.  Patient will demonstrate left UE MMT 5/5 throughout to facilitate lifting, reaching, carrying at [facility] in daily activity.   Goal status: New   5.  Patient will demonstrate left shoulder flexion >/= 150 degrees without symptoms to facilitate usual overhead reaching, self care, dressing at PLOF.    Goal status: New   6.  Pt will improve left shoulder ER to >/= 65 degrees in order to facility ADL's.   Goal status: New   7. Pt will improve  bilateral cervical rotation to >/= 65 degrees for improvements in functional mobility and activities of daily living.   Goal status: New     PLAN:  PT FREQUENCY: 1-2x/week  PT DURATION: 10 weeks  PLANNED INTERVENTIONS: Therapeutic exercises, Therapeutic activity, Neuro Muscular re-education, Balance training, Gait training, Patient/Family education, Joint mobilization, Stair training, DME instructions, Dry Needling, Electrical stimulation, Traction, Cryotherapy, vasopneumatic device Moist heat, Taping, Ultrasound, Ionotophoresis '4mg'$ /ml Dexamethasone, and Manual therapy.  All included unless contraindicated  PLAN FOR NEXT SESSION: likely needs HEP updated to AA/isometrics,   shoulder ROM, manual therapy as needed, vasopneumatic, assess cervical spine ROM next visit (deferred 10/19/22 as pain minimal)     Laureen Abrahams, PT, DPT 10/19/22 9:39 AM

## 2022-10-23 ENCOUNTER — Encounter: Payer: Self-pay | Admitting: Physical Therapy

## 2022-10-23 ENCOUNTER — Ambulatory Visit (INDEPENDENT_AMBULATORY_CARE_PROVIDER_SITE_OTHER): Payer: 59 | Admitting: Physical Therapy

## 2022-10-23 DIAGNOSIS — R600 Localized edema: Secondary | ICD-10-CM | POA: Diagnosis not present

## 2022-10-23 DIAGNOSIS — M542 Cervicalgia: Secondary | ICD-10-CM | POA: Diagnosis not present

## 2022-10-23 DIAGNOSIS — M25512 Pain in left shoulder: Secondary | ICD-10-CM

## 2022-10-23 DIAGNOSIS — M25612 Stiffness of left shoulder, not elsewhere classified: Secondary | ICD-10-CM

## 2022-10-23 NOTE — Therapy (Signed)
OUTPATIENT PHYSICAL THERAPY TREATMENT NOTE   Patient Name: Gregory Dorsey MRN: [id number] DOB:09-15-1966, 56 y.o., male Today's Date: 10/23/2022  END OF SESSION:   PT End of Session - 10/23/22 0844     Visit Number 3    Number of Visits 16    Date for PT Re-Evaluation 12/04/22    Progress Note Due on Visit 10    PT Start Time 0844    PT Stop Time 0924    PT Time Calculation (min) 40 min    Activity Tolerance Patient tolerated treatment well    Behavior During Therapy [facility] for tasks assessed/performed              Past Medical History:  Diagnosis Date   Kidney stone    x2   Reflux    Past Surgical History:  Procedure Laterality Date   WRIST SURGERY Left 29 years ago   Patient Active Problem List   Diagnosis Date Noted   Nontraumatic complete tear of left rotator cuff 07/10/2022   Impingement syndrome of left shoulder 07/10/2022   Tendinopathy of left biceps tendon 07/10/2022   Traumatic tear of supraspinatus tendon of right shoulder 06/22/2019   Family history of early CAD 07/22/2018   High cholesterol 05/29/2016   Erectile dysfunction 05/14/2015   Prediabetes 01/26/2013   GERD (gastroesophageal reflux disease) 01/12/2013   NEPHROLITHIASIS, HX OF 02/13/2008     THERAPY DIAG:  Acute pain of left shoulder  Stiffness of left shoulder, not elsewhere classified  Localized edema  Cervicalgia   PCP:  Jinny Sanders, MD   REFERRING PROVIDER: Frankey Shown MD  REFERRING DIAG: M75.42 (ICD-10-CM) - Impingement syndrome of left shoulder M75.122 (ICD-10-CM) - Nontraumatic complete tear of left rotator cuff  EVAL THERAPY DIAG:  Acute pain of left shoulder  Stiffness of left shoulder, not elsewhere classified  Facial weakness  Localized edema  Cervicalgia  Rationale for Evaluation and Treatment: Rehabilitation  ONSET DATE: 09/17/22   SUBJECTIVE:  SUBJECTIVE STATEMENT: Shoulder was a little sore after last session but only minimal  PERTINENT HISTORY:  LEFT SHOULDER SCOPE, RCR, BICEPS TENODESIS, SAD, EXTENSIVE DEBRIDEMENT 09/17/22    Kidney stones, reflux, wrist surgery  PAIN:  NPRS scale: no pain at rest   PRECAUTIONS: Shoulder  WEIGHT BEARING RESTRICTIONS: No  FALLS:  Has patient fallen in last 6 months? No  LIVING ENVIRONMENT: Lives with: lives with their family and lives with their spouse Lives in: House/apartment Stairs: Yes: External: 3 steps; none Has following equipment at home: None  OCCUPATION:   Owns a farm  PLOF: Independent  PATIENT GOALS: Stop hurting     OBJECTIVE:   DIAGNOSTIC FINDINGS: 07/10/22  MRI of the right shoulder and cervical spine are relatively unremarkable.  The prior rotator cuff repair is intact.  He has some bulging disks with some mild to moderate foraminal narrowing but his symptoms are more consistent with MRI findings of the left shoulder which shows a full-thickness supraspinatus tear with slight retraction.  He has medial dislocation of the biceps tendon.  Unfavorable acromion and significant AC joint arthritis.  Based on these findings I have recommended arthroscopic rotator cuff repair and biceps tenodesis, debridements as indicated.   PATIENT SURVEYS:  10/06/22: FOTO intake:  37%   POSTURE: Forward head and rounded shoulder  UPPER EXTREMITY ROM:   ROM Right 10/06/22 Active supine Left 10/06/22 Passive supine Left 10/19/22 Passive  supine  Shoulder flexion 160 122 140  Shoulder extension  20   Shoulder abduction 162 86   Shoulder adduction     Shoulder internal rotation 64 Shoulder abd 45 deg 52 Shoulder abd 45 deg    Shoulder external rotation 75 60 73  (50 deg abdct)  Elbow flexion 126 125   Elbow extension 0 0   Wrist flexion     Wrist extension     Wrist ulnar deviation     Wrist radial deviation     Wrist pronation     Wrist supination     (Blank rows = not tested)  CERVICAL active ROM   Cervical flexion: 25 deg Cervical extension; 18 deg with discomfort noted Cervical rotation left:  54 deg Cervical rotation right:  56 deg Cervical side bending left: 22 deg Cervical side bending right:  32 deg   UPPER EXTREMITY MMT:  MMT Right 10/06/22 Left 10/06/22  Shoulder flexion 5/5   Shoulder extension 5/5   Shoulder abduction 5/5   Shoulder adduction    Shoulder internal rotation 5/5   Shoulder external rotation 5/5   Middle trapezius    Lower trapezius    Elbow flexion    Elbow extension    Wrist flexion    Wrist extension    Wrist ulnar deviation    Wrist radial deviation    Wrist pronation    Wrist supination    Grip strength (lbs)    (Blank rows = not tested)     PALPATION:  TTP, anterior shoulder, around incision site   TODAY'S TREATMENT:  DATE: 10/23/22 TherEx Pulleys flexion and scaption x 3 min each Supine AA flexion with 1# bar x 20 reps Supine AA ER with 1# bar on Lt x 20 reps AA flexion to 60-90 deg supine with RUE assist, then LUE only eccentric lowering x 5 reps Shoulder isometrics 20 x 5 sec hold on Lt: Flexion Abduction ER IR Rows L3 band x 20 reps; 5 sec hold   10/19/22 TherEx Pulleys flexion and scaption x 3 min each Supine AA flexion 2# bar 2x10 Supine AA ER 2# bar 2x10  Seated scapular retraction x10 reps; 5 sec hold Standing Lt shoulder flexion isometrics 5 sec hold; 2x10 Standing Lt shoulder abduction isometrics 5 sec hold; 2x10  10/06/22:  Therex:  HEP instruction/performance c cues for techniques, handout provided.   Trial set performed of each for comprehension and symptom assessment.  See below for exercise list   PATIENT EDUCATION: Education details: HEP, POC Person educated: Patient Education method: Explanation, Demonstration, Verbal cues, and Handouts Education comprehension: verbalized understanding, returned demonstration, and verbal cues required  HOME EXERCISE PROGRAM: Access Code: [email or web address] URL: [email or web address] Date: 10/23/2022 Prepared by: Faustino Congress  Exercises - Seated Shoulder Flexion Towel Slide at Table Top Full Range of Motion  - 3 x daily - 7 x weekly - 2 sets - 10 reps - 5 seconds hold - Seated Shoulder Scaption Slide at Table Top with Forearm in Neutral  - 3 x daily - 7 x weekly - 2 sets - 10 reps - 5 seconds hold - Seated Shoulder External Rotation PROM on Table  - 3 x daily - 7 x weekly - 2 sets - 10 reps - 5 seconds hold - Seated Upper Trapezius Stretch  - 2-3 x daily - 7 x weekly - 3 reps - 10 sec hold - Supine Shoulder Flexion with Dowel  - 2 x daily - 7 x weekly - 2 sets - 10 reps - 3-5 sec hold - Supine Shoulder External Rotation in 45 Degrees Abduction AAROM with Dowel  - 2 x daily - 7 x weekly - 2 sets - 10 reps - Isometric Shoulder Flexion with Ball at [employer]  - 2 x daily - 7 x weekly - 2 sets - 10 reps - 5 sec hold - Isometric Shoulder Abduction with Ball at [employer]  - 2 x daily - 7 x weekly - 2 sets - 10 reps - 5 sec hold - Isometric Shoulder External Rotation with Ball at [employer]  - 2 x daily - 7 x weekly - 2 sets - 10 reps - 5 sec hold - Standing Isometric Shoulder Internal Rotation at Wall with Ball  - 2 x daily - 7 x weekly - 2 sets - 10 reps - 5 sec hold  ASSESSMENT:  CLINICAL IMPRESSION: Able to progress HEP today to include AA and isometrics, which pt tolerated well.   Will continue to benefit from PT to maximize function.  OBJECTIVE IMPAIRMENTS: decreased activity tolerance, decreased mobility, decreased ROM, decreased strength, impaired  UE functional use, and pain.   ACTIVITY LIMITATIONS: carrying, lifting, sleeping, and reach over head  PARTICIPATION LIMITATIONS: occupation and yard work  PERSONAL FACTORS: see pertinent medical  are also affecting patient's functional outcome.   REHAB POTENTIAL: Good  CLINICAL DECISION MAKING: Stable/uncomplicated  EVALUATION COMPLEXITY: Low   GOALS: Goals reviewed with patient? Yes  SHORT TERM GOALS: (target date for Short term goals are 3 weeks 10/30/22)  1.Patient will demonstrate independent use of home exercise program to maintain  progress from in clinic treatments. Goal status: New  LONG TERM GOALS: (target dates for all long term goals are 10 weeks  12/18/2022)   1. Patient will demonstrate/report pain at worst less than or equal to 2/10 to facilitate minimal limitation in daily activity secondary to pain symptoms. Goal status: New   2. Patient will demonstrate independent use of home exercise program to facilitate ability to maintain/progress functional gains from skilled physical therapy services. Goal status: New   3. Patient will demonstrate FOTO outcome > or = 66 % to indicate reduced disability due to condition. Goal status: New   4.  Patient will demonstrate left UE MMT 5/5 throughout to facilitate lifting, reaching, carrying at [facility] in daily activity.   Goal status: New   5.  Patient will demonstrate left shoulder flexion >/= 150 degrees without symptoms to facilitate usual overhead reaching, self care, dressing at PLOF.    Goal status: New   6.  Pt will improve left shoulder ER to >/= 65 degrees in order to facility ADL's.   Goal status: New   7. Pt will improve bilateral cervical rotation to >/= 65 degrees for improvements in functional mobility and activities of daily living.   Goal status: New     PLAN:  PT FREQUENCY: 1-2x/week  PT DURATION: 10 weeks  PLANNED INTERVENTIONS: Therapeutic exercises, Therapeutic activity, Neuro Muscular  re-education, Balance training, Gait training, Patient/Family education, Joint mobilization, Stair training, DME instructions, Dry Needling, Electrical stimulation, Traction, Cryotherapy, vasopneumatic device Moist heat, Taping, Ultrasound, Ionotophoresis '4mg'$ /ml Dexamethasone, and Manual therapy.  All included unless contraindicated  PLAN FOR NEXT SESSION: review updated HEP,  deltoid activation, shoulder ROM, manual therapy as needed, vasopneumatic, assess cervical spine ROM next visit (deferred 10/19/22 as pain minimal)     Laureen Abrahams, PT, DPT 10/23/22 9:24 AM

## 2022-10-28 ENCOUNTER — Ambulatory Visit (INDEPENDENT_AMBULATORY_CARE_PROVIDER_SITE_OTHER): Payer: 59 | Admitting: Orthopaedic Surgery

## 2022-10-28 DIAGNOSIS — G8929 Other chronic pain: Secondary | ICD-10-CM

## 2022-10-28 DIAGNOSIS — M25512 Pain in left shoulder: Secondary | ICD-10-CM

## 2022-10-28 DIAGNOSIS — M7542 Impingement syndrome of left shoulder: Secondary | ICD-10-CM

## 2022-10-28 DIAGNOSIS — M75122 Complete rotator cuff tear or rupture of left shoulder, not specified as traumatic: Secondary | ICD-10-CM

## 2022-10-28 NOTE — Progress Notes (Signed)
   Post-Op Visit Note   Patient: Gregory Dorsey           Date of Birth: [birth year]           MRN: [id number] Visit Date: 10/28/2022 PCP: Jinny Sanders, MD   Assessment & Plan:  Chief Complaint:  Chief Complaint  Patient presents with   Left Shoulder - Routine Post Op, Follow-up    09/17/2022 left shoulder arthroscopy   Visit Diagnoses:  1. Chronic left shoulder pain   2. Impingement syndrome of left shoulder   3. Nontraumatic complete tear of left rotator cuff     Plan: [name] returns today 6 weeks status post left shoulder scope biceps tenodesis irreparable rotator cuff tear.  Overall he feels things are getting better.  He has been to 3 sessions of PT.  He is sleeping better and not taking any pain medications.  His active range of motion is still slow to progress.  Examination left shoulder shows well-healed surgical scars.  He has intact sensation to the lateral deltoid.  He has trouble initiating active range of motion with flexion and abduction.  External rotation strength is normal.  He has no Popeye deformity.  Patient is now 6 weeks status post surgery.  He is having trouble initiating active range of motion.  I think this is due to rotator cuff deficiency.  He has excellent passive range of motion so not concerned about adhesive capsulitis.  At this point my advice is to continue with outpatient PT and home exercises.  Recheck in 6 weeks.  Follow-Up Instructions: Return in about 6 weeks (around 12/09/2022).   Orders:  No orders of the defined types were placed in this encounter.  No orders of the defined types were placed in this encounter.   Imaging: No results found.  PMFS History: Patient Active Problem List   Diagnosis Date Noted   Nontraumatic complete tear of left rotator cuff 07/10/2022   Impingement syndrome of left shoulder 07/10/2022   Tendinopathy of left biceps tendon 07/10/2022   Traumatic tear of supraspinatus tendon of right shoulder  06/22/2019   Family history of early CAD 07/22/2018   High cholesterol 05/29/2016   Erectile dysfunction 05/14/2015   Prediabetes 01/26/2013   GERD (gastroesophageal reflux disease) 01/12/2013   NEPHROLITHIASIS, HX OF 02/13/2008   Past Medical History:  Diagnosis Date   Kidney stone    x2   Reflux     Family History  Problem Relation Age of Onset   Arthritis Mother    Heart disease Father 37       CAD   Colon polyps Father    Colon cancer Neg Hx    Esophageal cancer Neg Hx    Rectal cancer Neg Hx    Stomach cancer Neg Hx     Past Surgical History:  Procedure Laterality Date   WRIST SURGERY Left 29 years ago   Social History   Occupational History   Not on file  Tobacco Use   Smoking status: Never   Smokeless tobacco: Never  Vaping Use   Vaping Use: Never used  Substance and Sexual Activity   Alcohol use: No   Drug use: No   Sexual activity: Not on file

## 2022-10-29 ENCOUNTER — Ambulatory Visit (INDEPENDENT_AMBULATORY_CARE_PROVIDER_SITE_OTHER): Payer: 59 | Admitting: Physical Therapy

## 2022-10-29 ENCOUNTER — Encounter: Payer: Self-pay | Admitting: Physical Therapy

## 2022-10-29 DIAGNOSIS — M542 Cervicalgia: Secondary | ICD-10-CM | POA: Diagnosis not present

## 2022-10-29 DIAGNOSIS — M25512 Pain in left shoulder: Secondary | ICD-10-CM | POA: Diagnosis not present

## 2022-10-29 DIAGNOSIS — M25612 Stiffness of left shoulder, not elsewhere classified: Secondary | ICD-10-CM | POA: Diagnosis not present

## 2022-10-29 DIAGNOSIS — R600 Localized edema: Secondary | ICD-10-CM | POA: Diagnosis not present

## 2022-10-29 NOTE — Therapy (Signed)
OUTPATIENT PHYSICAL THERAPY TREATMENT NOTE   Patient Name: Gregory Dorsey MRN: [id number] DOB:[birth year], 56 y.o., male Today's Date: 10/29/2022  END OF SESSION:   PT End of Session - 10/29/22 0811     Visit Number 4    Number of Visits 16    Date for PT Re-Evaluation 12/04/22    Progress Note Due on Visit 10    PT Start Time 0807    PT Stop Time 0845    PT Time Calculation (min) 38 min    Activity Tolerance Patient tolerated treatment well    Behavior During Therapy [facility] for tasks assessed/performed              Past Medical History:  Diagnosis Date   Kidney stone    x2   Reflux    Past Surgical History:  Procedure Laterality Date   WRIST SURGERY Left 29 years ago   Patient Active Problem List   Diagnosis Date Noted   Nontraumatic complete tear of left rotator cuff 07/10/2022   Impingement syndrome of left shoulder 07/10/2022   Tendinopathy of left biceps tendon 07/10/2022   Traumatic tear of supraspinatus tendon of right shoulder 06/22/2019   Family history of early CAD 07/22/2018   High cholesterol 05/29/2016   Erectile dysfunction 05/14/2015   Prediabetes 01/26/2013   GERD (gastroesophageal reflux disease) 01/12/2013   NEPHROLITHIASIS, HX OF 02/13/2008     THERAPY DIAG:  Acute pain of left shoulder  Stiffness of left shoulder, not elsewhere classified  Localized edema  Cervicalgia   PCP:  Jinny Sanders, MD   REFERRING PROVIDER: Frankey Shown MD  REFERRING DIAG: M75.42 (ICD-10-CM) - Impingement syndrome of left shoulder M75.122 (ICD-10-CM) - Nontraumatic complete tear of left rotator cuff  EVAL THERAPY DIAG:  Acute pain of left shoulder  Stiffness of left shoulder, not elsewhere classified  Facial weakness  Localized edema  Cervicalgia  Rationale for Evaluation and Treatment: Rehabilitation  ONSET DATE: 09/17/22   SUBJECTIVE:  SUBJECTIVE STATEMENT: Shoulder is not having any pain unless he tries to use it.  PERTINENT HISTORY:  LEFT SHOULDER SCOPE, RCR, BICEPS TENODESIS, SAD, EXTENSIVE DEBRIDEMENT 09/17/22    Kidney stones, reflux, wrist surgery  PAIN:  NPRS scale: no pain at rest   PRECAUTIONS: Shoulder  WEIGHT BEARING RESTRICTIONS: No  FALLS:  Has patient fallen in last 6 months? No  LIVING ENVIRONMENT: Lives with: lives with their family and lives with their spouse Lives in: House/apartment Stairs: Yes: External: 3 steps; none Has following equipment at home: None  OCCUPATION:   Owns a farm  PLOF: Independent  PATIENT GOALS: Stop hurting     OBJECTIVE:   DIAGNOSTIC FINDINGS: 07/10/22  MRI of the right shoulder and cervical spine are relatively unremarkable.  The prior rotator cuff repair is intact.  He has some bulging disks with some mild to moderate foraminal narrowing but his symptoms are more consistent with MRI findings of the left shoulder which shows a full-thickness supraspinatus tear with slight retraction.  He has medial dislocation of the biceps tendon.  Unfavorable acromion and significant AC joint arthritis.  Based on these findings I have recommended arthroscopic rotator cuff repair and biceps tenodesis, debridements as indicated.   PATIENT SURVEYS:  10/06/22: FOTO intake:  37%   POSTURE: Forward head and rounded shoulder  UPPER EXTREMITY ROM:   ROM Right 10/06/22 Active supine Left 10/06/22 Passive supine Left 10/19/22 Passive  supine  Shoulder flexion 160 122 140  Shoulder extension  20   Shoulder abduction 162 86   Shoulder adduction     Shoulder internal rotation 64 Shoulder abd 45 deg 52 Shoulder abd 45 deg    Shoulder external rotation 75 60 73  (50 deg abdct)  Elbow flexion 126 125   Elbow extension 0 0   Wrist flexion     Wrist extension     Wrist ulnar deviation     Wrist radial deviation     Wrist pronation     Wrist supination     (Blank rows = not tested)  CERVICAL active ROM   Cervical flexion: 25 deg Cervical extension; 18 deg with discomfort noted Cervical rotation left:  54 deg Cervical rotation right:  56 deg Cervical side bending left: 22 deg Cervical side bending right:  32 deg   UPPER EXTREMITY MMT:  MMT Right 10/06/22 Left 10/06/22  Shoulder flexion 5/5   Shoulder extension 5/5   Shoulder abduction 5/5   Shoulder adduction    Shoulder internal rotation 5/5   Shoulder external rotation 5/5   Middle trapezius    Lower trapezius    Elbow flexion    Elbow extension    Wrist flexion    Wrist extension    Wrist ulnar deviation    Wrist radial deviation    Wrist pronation    Wrist supination    Grip strength (lbs)    (Blank rows = not tested)     PALPATION:  TTP, anterior shoulder, around incision site   TODAY'S TREATMENT:  DATE: 10/29/22 TherEx UBE L2 5 min Supine AA flexion with 3# bar x 15 reps Supine AA ER with 1# bar on Lt x 15 reps AA flexion to 60-90 deg supine with RUE assist, then LUE only eccentric lowering x 15 reps AROM pendulums X 15 circles CW and CCW, X 15 abduction-adduction, X 15 flexion-extension Shoulder isometrics 15 x 5 sec hold on Lt: Flexion Abduction ER IR Rows red band x 2X10 Shoulder extensions red band 2X10  TODAY'S TREATMENT:                                                                                                                           DATE: 10/23/22 TherEx Pulleys flexion and scaption x 3 min each Supine AA flexion with 1# bar x 20 reps Supine AA ER with 1# bar on Lt x 20 reps AA  flexion to 60-90 deg supine with RUE assist, then LUE only eccentric lowering x 5 reps Shoulder isometrics 20 x 5 sec hold on Lt: Flexion Abduction ER IR Rows L3 band x 20 reps; 5 sec hold   PATIENT EDUCATION: Education details: HEP, POC Person educated: Patient Education method: Consulting civil engineer, Media planner, Verbal cues, and Handouts Education comprehension: verbalized understanding, returned demonstration, and verbal cues required  HOME EXERCISE PROGRAM: Access Code: [email or web address] URL: [email or web address] Date: 10/29/2022 Prepared by: Elsie Ra  Exercises - Seated Upper Trapezius Stretch  - 2-3 x daily - 7 x weekly - 3 reps - 10 sec hold - Supine Shoulder Flexion with Dowel  - 2 x daily - 7 x weekly - 2 sets - 10 reps - 3-5 sec hold - Supine Shoulder External Rotation in 45 Degrees Abduction AAROM with Dowel  - 2 x daily - 7 x weekly - 2 sets - 10 reps - Isometric Shoulder Flexion with Ball at [employer]  - 2 x daily - 7 x weekly - 2 sets - 10 reps - 5 sec hold - Isometric Shoulder Abduction with Ball at [employer]  - 2 x daily - 7 x weekly - 2 sets - 10 reps - 5 sec hold - Isometric Shoulder External Rotation with Ball at [employer]  - 2 x daily - 7 x weekly - 2 sets - 10 reps - 5 sec hold - Standing Isometric Shoulder Internal Rotation at Wall with Ball  - 2 x daily - 7 x weekly - 2 sets - 10 reps - 5 sec hold - Standing Row with Anchored Resistance  - 2 x daily - 6 x weekly - 2-3 sets - 10 reps - Shoulder extension with resistance - Neutral  - 2 x daily - 6 x weekly - 2-3 sets - 10 reps - Shoulder Internal Rotation with Resistance (Mirrored)  - 2 x daily - 6 x weekly - 2-3 sets - 10 reps - Shoulder External Rotation with Anchored Resistance  - 2 x daily - 6 x weekly - 2-3 sets - 10 reps  ASSESSMENT:  CLINICAL IMPRESSION: He  is beyond 6 weeks post op now so began to work on more strengthening which is very limited with him right now. I provided him with red band to take home and  printed out strengthening exercises he can perform with these. Deltoid and RTC activation will be our top priority as his ROM is doing well.   OBJECTIVE IMPAIRMENTS: decreased activity tolerance, decreased mobility, decreased ROM, decreased strength, impaired UE functional use, and pain.   ACTIVITY LIMITATIONS: carrying, lifting, sleeping, and reach over head  PARTICIPATION LIMITATIONS: occupation and yard work  PERSONAL FACTORS: see pertinent medical  are also affecting patient's functional outcome.   REHAB POTENTIAL: Good  CLINICAL DECISION MAKING: Stable/uncomplicated  EVALUATION COMPLEXITY: Low   GOALS: Goals reviewed with patient? Yes  SHORT TERM GOALS: (target date for Short term goals are 3 weeks 10/30/22)  1.Patient will demonstrate independent use of home exercise program to maintain progress from in clinic treatments. Goal status: New  LONG TERM GOALS: (target dates for all long term goals are 10 weeks  12/18/2022)   1. Patient will demonstrate/report pain at worst less than or equal to 2/10 to facilitate minimal limitation in daily activity secondary to pain symptoms. Goal status: New   2. Patient will demonstrate independent use of home exercise program to facilitate ability to maintain/progress functional gains from skilled physical therapy services. Goal status: New   3. Patient will demonstrate FOTO outcome > or = 66 % to indicate reduced disability due to condition. Goal status: New   4.  Patient will demonstrate left UE MMT 5/5 throughout to facilitate lifting, reaching, carrying at [facility] in daily activity.   Goal status: New   5.  Patient will demonstrate left shoulder flexion >/= 150 degrees without symptoms to facilitate usual overhead reaching, self care, dressing at PLOF.    Goal status: New   6.  Pt will improve left shoulder ER to >/= 65 degrees in order to facility ADL's.   Goal status: New   7. Pt will improve bilateral cervical rotation to >/=  65 degrees for improvements in functional mobility and activities of daily living.   Goal status: New     PLAN:  PT FREQUENCY: 1-2x/week  PT DURATION: 10 weeks  PLANNED INTERVENTIONS: Therapeutic exercises, Therapeutic activity, Neuro Muscular re-education, Balance training, Gait training, Patient/Family education, Joint mobilization, Stair training, DME instructions, Dry Needling, Electrical stimulation, Traction, Cryotherapy, vasopneumatic device Moist heat, Taping, Ultrasound, Ionotophoresis '4mg'$ /ml Dexamethasone, and Manual therapy.  All included unless contraindicated  PLAN FOR NEXT SESSION: RTC and deltoid activation, scapular strength progressions as tolerated.  Elsie Ra, PT, DPT 10/29/22 8:11 AM

## 2022-11-03 ENCOUNTER — Encounter: Payer: Self-pay | Admitting: Physical Therapy

## 2022-11-03 ENCOUNTER — Ambulatory Visit (INDEPENDENT_AMBULATORY_CARE_PROVIDER_SITE_OTHER): Payer: 59 | Admitting: Physical Therapy

## 2022-11-03 DIAGNOSIS — M542 Cervicalgia: Secondary | ICD-10-CM

## 2022-11-03 DIAGNOSIS — M25612 Stiffness of left shoulder, not elsewhere classified: Secondary | ICD-10-CM | POA: Diagnosis not present

## 2022-11-03 DIAGNOSIS — M25512 Pain in left shoulder: Secondary | ICD-10-CM | POA: Diagnosis not present

## 2022-11-03 DIAGNOSIS — R600 Localized edema: Secondary | ICD-10-CM

## 2022-11-03 NOTE — Therapy (Signed)
OUTPATIENT PHYSICAL THERAPY TREATMENT NOTE   Patient Name: Gregory Dorsey MRN: [id number] DOB:[birth year], 57 y.o., male Today's Date: 11/03/2022  END OF SESSION:   PT End of Session - 11/03/22 0811     Visit Number 5    Number of Visits 16    Date for PT Re-Evaluation 12/04/22    Progress Note Due on Visit 10    PT Start Time 0801    PT Stop Time 0841    PT Time Calculation (min) 40 min    Activity Tolerance Patient tolerated treatment well    Behavior During Therapy [facility] for tasks assessed/performed              Past Medical History:  Diagnosis Date   Kidney stone    x2   Reflux    Past Surgical History:  Procedure Laterality Date   WRIST SURGERY Left 29 years ago   Patient Active Problem List   Diagnosis Date Noted   Nontraumatic complete tear of left rotator cuff 07/10/2022   Impingement syndrome of left shoulder 07/10/2022   Tendinopathy of left biceps tendon 07/10/2022   Traumatic tear of supraspinatus tendon of right shoulder 06/22/2019   Family history of early CAD 07/22/2018   High cholesterol 05/29/2016   Erectile dysfunction 05/14/2015   Prediabetes 01/26/2013   GERD (gastroesophageal reflux disease) 01/12/2013   NEPHROLITHIASIS, HX OF 02/13/2008     THERAPY DIAG:  Acute pain of left shoulder  Stiffness of left shoulder, not elsewhere classified  Localized edema  Cervicalgia   PCP:  Jinny Sanders, MD   REFERRING PROVIDER: Frankey Shown MD  REFERRING DIAG: M75.42 (ICD-10-CM) - Impingement syndrome of left shoulder M75.122 (ICD-10-CM) - Nontraumatic complete tear of left rotator cuff  EVAL THERAPY DIAG:  Acute pain of left shoulder  Stiffness of left shoulder, not elsewhere classified  Facial weakness  Localized edema  Cervicalgia  Rationale for Evaluation and Treatment: Rehabilitation  ONSET DATE: 09/17/22   SUBJECTIVE:  Subjective:  Pt still reporting pain with active movements. No pain at rest  PERTINENT HISTORY:  LEFT SHOULDER SCOPE, RCR, BICEPS TENODESIS, SAD, EXTENSIVE DEBRIDEMENT 09/17/22    Kidney stones, reflux, wrist surgery  PAIN:  NPRS scale: no pain at rest and with flexion movments below 90 degrees, no pain on UBE   PRECAUTIONS: Shoulder  WEIGHT BEARING RESTRICTIONS: No  FALLS:  Has patient fallen in last 6 months? No  LIVING ENVIRONMENT: Lives with: lives with their family and lives with their spouse Lives in: House/apartment Stairs: Yes: External: 3 steps; none Has following equipment at home: None  OCCUPATION:   Owns a farm  PLOF: Independent  PATIENT GOALS: Stop hurting     OBJECTIVE:   DIAGNOSTIC FINDINGS: 07/10/22  MRI of the right shoulder and cervical spine are relatively unremarkable.  The prior rotator cuff repair is intact.  He has some bulging disks with some mild to moderate foraminal narrowing but his symptoms are more consistent with MRI findings of the left shoulder which shows a full-thickness supraspinatus tear with slight retraction.  He has medial dislocation of the biceps tendon.  Unfavorable acromion and significant AC joint arthritis.  Based on these findings I have recommended arthroscopic rotator cuff repair and biceps tenodesis, debridements as indicated.   PATIENT SURVEYS:  10/06/22: FOTO intake:  37%   POSTURE: Forward head and rounded shoulder  UPPER EXTREMITY ROM:   ROM Right 10/06/22 Active supine Left 10/06/22 Passive supine Left 10/19/22 Passive  supine Left 11/03/21 Passive supine  Shoulder flexion 160 122 140 154  Shoulder extension  20    Shoulder abduction 162 86  148  Shoulder  adduction      Shoulder internal rotation 64 Shoulder abd 45 deg 52 Shoulder abd 45 deg  75 deg(shoulder abd to 45)  Shoulder external rotation 75 60 73  (50 deg abdct) 78  (Shoulder abd 45 deg)  Elbow flexion 126 125    Elbow extension 0 0    Wrist flexion      Wrist extension      Wrist ulnar deviation      Wrist radial deviation      Wrist pronation      Wrist supination      (Blank rows = not tested)  CERVICAL active ROM    Cervical flexion: 25 deg Cervical extension; 18 deg with discomfort noted Cervical rotation left:  54 deg Cervical rotation right:  56 deg Cervical side bending left: 22 deg Cervical side bending right:  32 deg   11/03/21:  Cervical flexion: 30 deg Cervical extension; 26 deg with discomfort noted Cervical rotation left:  56 deg Cervical rotation right:  60 deg    UPPER EXTREMITY MMT:  MMT Right 10/06/22 Left 10/06/22  Shoulder flexion 5/5   Shoulder extension 5/5   Shoulder abduction 5/5   Shoulder adduction    Shoulder internal rotation 5/5   Shoulder external rotation 5/5   Middle trapezius    Lower trapezius    Elbow flexion    Elbow extension    Wrist flexion    Wrist extension    Wrist ulnar deviation    Wrist radial deviation    Wrist pronation    Wrist supination    Grip strength (lbs)    (Blank rows = not tested)     PALPATION:  TTP, anterior shoulder, around incision site   TODAY'S TREATMENT:  DATE: 11/03/21 TherEx UBE 4 minutes each direction Level 2.5 Supine AA flexion with 3# bar x 15 reps Supine AA ER with 1# bar on Lt x 15 reps Sidelying ER  2 x 10  Shoulder isometrics 10 x 5 sec hold on Lt: Flexion Abduction ER IR Rows green  band x 15 Shoulder extensions red band x 20 holding 3 sec Wall ladder x 5 flexion and scaption Rolling red physioball up and down wall x 10 bilateral UE's  UE  ranger circles in standing x 30 sec each direction  Measurements recorded  DATE: 10/29/22 TherEx UBE L2 5 min Supine AA flexion with 3# bar x 15 reps Supine AA ER with 1# bar on Lt x 15 reps AA flexion to 60-90 deg supine with RUE assist, then LUE only eccentric lowering x 15 reps AROM pendulums X 15 circles CW and CCW, X 15 abduction-adduction, X 15 flexion-extension Shoulder isometrics 15 x 5 sec hold on Lt: Flexion Abduction ER IR Rows red band x 2X10 Shoulder extensions red band 2X10  TODAY'S TREATMENT:                                                                                                                           DATE: 10/23/22 TherEx Pulleys flexion and scaption x 3 min each Supine AA flexion with 1# bar x 20 reps Supine AA ER with 1# bar on Lt x 20 reps AA flexion to 60-90 deg supine with RUE assist, then LUE only eccentric lowering x 5 reps Shoulder isometrics 20 x 5 sec hold on Lt: Flexion Abduction ER IR Rows L3 band x 20 reps; 5 sec hold   PATIENT EDUCATION: Education details: HEP, POC Person educated: Patient Education method: Consulting civil engineer, Media planner, Verbal cues, and Handouts Education comprehension: verbalized understanding, returned demonstration, and verbal cues required  HOME EXERCISE PROGRAM: Access Code: [email or web address] URL: [email or web address] Date: 10/29/2022 Prepared by: Elsie Ra  Exercises - Seated Upper Trapezius Stretch  - 2-3 x daily - 7 x weekly - 3 reps - 10 sec hold - Supine Shoulder Flexion with Dowel  - 2 x daily - 7 x weekly - 2 sets - 10 reps - 3-5 sec hold - Supine Shoulder External Rotation in 45 Degrees Abduction AAROM with Dowel  - 2 x daily - 7 x weekly - 2 sets - 10 reps - Isometric Shoulder Flexion with Ball at [employer]  - 2 x daily - 7 x weekly - 2 sets - 10 reps - 5 sec hold - Isometric Shoulder Abduction with Ball at [employer]  - 2 x daily - 7 x weekly - 2 sets - 10 reps - 5 sec hold - Isometric Shoulder  External Rotation with Ball at [employer]  - 2 x daily - 7 x weekly - 2 sets - 10 reps - 5 sec hold - Standing Isometric Shoulder Internal Rotation at Wall with Ball  - 2 x daily - 7 x weekly - 2 sets -  10 reps - 5 sec hold - Standing Row with Anchored Resistance  - 2 x daily - 6 x weekly - 2-3 sets - 10 reps - Shoulder extension with resistance - Neutral  - 2 x daily - 6 x weekly - 2-3 sets - 10 reps - Shoulder Internal Rotation with Resistance (Mirrored)  - 2 x daily - 6 x weekly - 2-3 sets - 10 reps - Shoulder External Rotation with Anchored Resistance  - 2 x daily - 6 x weekly - 2-3 sets - 10 reps  ASSESSMENT:  CLINICAL IMPRESSION: Pt arriving today with no pain at rest. Pt reporting he now has a co-pay pf $75 and he may have to decrease his frequency due to high costs. Pt tolerating all exercises well with some pain reported with flexion and ER activities. Pt reporting compliance in his updated HEP. Pt with improvements in his overall ROM from initial eval however presenting with significance weakness. Continue skilled PT as tolerated toward LTG"s set.   OBJECTIVE IMPAIRMENTS: decreased activity tolerance, decreased mobility, decreased ROM, decreased strength, impaired UE functional use, and pain.   ACTIVITY LIMITATIONS: carrying, lifting, sleeping, and reach over head  PARTICIPATION LIMITATIONS: occupation and yard work  PERSONAL FACTORS: see pertinent medical  are also affecting patient's functional outcome.   REHAB POTENTIAL: Good  CLINICAL DECISION MAKING: Stable/uncomplicated  EVALUATION COMPLEXITY: Low   GOALS: Goals reviewed with patient? Yes  SHORT TERM GOALS: (target date for Short term goals are 3 weeks 10/30/22)  1.Patient will demonstrate independent use of home exercise program to maintain progress from in clinic treatments. Goal status: New  LONG TERM GOALS: (target dates for all long term goals are 10 weeks  12/18/2022)   1. Patient will demonstrate/report pain at  worst less than or equal to 2/10 to facilitate minimal limitation in daily activity secondary to pain symptoms. Goal status: New   2. Patient will demonstrate independent use of home exercise program to facilitate ability to maintain/progress functional gains from skilled physical therapy services. Goal status: New   3. Patient will demonstrate FOTO outcome > or = 66 % to indicate reduced disability due to condition. Goal status: New   4.  Patient will demonstrate left UE MMT 5/5 throughout to facilitate lifting, reaching, carrying at [facility] in daily activity.   Goal status: New   5.  Patient will demonstrate left shoulder flexion >/= 150 degrees without symptoms to facilitate usual overhead reaching, self care, dressing at PLOF.    Goal status: New   6.  Pt will improve left shoulder ER to >/= 65 degrees in order to facility ADL's.   Goal status: New   7. Pt will improve bilateral cervical rotation to >/= 65 degrees for improvements in functional mobility and activities of daily living.   Goal status: New     PLAN:  PT FREQUENCY: 1-2x/week  PT DURATION: 10 weeks  PLANNED INTERVENTIONS: Therapeutic exercises, Therapeutic activity, Neuro Muscular re-education, Balance training, Gait training, Patient/Family education, Joint mobilization, Stair training, DME instructions, Dry Needling, Electrical stimulation, Traction, Cryotherapy, vasopneumatic device Moist heat, Taping, Ultrasound, Ionotophoresis '4mg'$ /ml Dexamethasone, and Manual therapy.  All included unless contraindicated  PLAN FOR NEXT SESSION: RTC and deltoid activation, scapular strength progressions as tolerated.    Kearney Hard, PT, MPT 11/03/22 8:48 AM   11/03/22 8:48 AM

## 2022-11-05 ENCOUNTER — Ambulatory Visit (INDEPENDENT_AMBULATORY_CARE_PROVIDER_SITE_OTHER): Payer: 59 | Admitting: Physical Therapy

## 2022-11-05 ENCOUNTER — Encounter: Payer: Self-pay | Admitting: Physical Therapy

## 2022-11-05 DIAGNOSIS — R600 Localized edema: Secondary | ICD-10-CM | POA: Diagnosis not present

## 2022-11-05 DIAGNOSIS — M25612 Stiffness of left shoulder, not elsewhere classified: Secondary | ICD-10-CM | POA: Diagnosis not present

## 2022-11-05 DIAGNOSIS — M542 Cervicalgia: Secondary | ICD-10-CM

## 2022-11-05 DIAGNOSIS — M25512 Pain in left shoulder: Secondary | ICD-10-CM | POA: Diagnosis not present

## 2022-11-05 NOTE — Therapy (Addendum)
OUTPATIENT PHYSICAL THERAPY TREATMENT NOTE   Patient Name: Gregory Dorsey MRN: [id number] DOB:12-20-1965, 57 y.o., male Today's Date: 11/05/2022  END OF SESSION:   PT End of Session - 11/05/22 0809     Visit Number 6    Number of Visits 16    Date for PT Re-Evaluation 12/04/22    Progress Note Due on Visit 10    PT Start Time 0803    PT Stop Time 0843    PT Time Calculation (min) 40 min    Activity Tolerance Patient tolerated treatment well    Behavior During Therapy [facility] for tasks assessed/performed               Past Medical History:  Diagnosis Date   Kidney stone    x2   Reflux    Past Surgical History:  Procedure Laterality Date   WRIST SURGERY Left 29 years ago   Patient Active Problem List   Diagnosis Date Noted   Nontraumatic complete tear of left rotator cuff 07/10/2022   Impingement syndrome of left shoulder 07/10/2022   Tendinopathy of left biceps tendon 07/10/2022   Traumatic tear of supraspinatus tendon of right shoulder 06/22/2019   Family history of early CAD 07/22/2018   High cholesterol 05/29/2016   Erectile dysfunction 05/14/2015   Prediabetes 01/26/2013   GERD (gastroesophageal reflux disease) 01/12/2013   NEPHROLITHIASIS, HX OF 02/13/2008     THERAPY DIAG:  Acute pain of left shoulder  Stiffness of left shoulder, not elsewhere classified  Localized edema  Cervicalgia   PCP:  Jinny Sanders, MD   REFERRING PROVIDER: Frankey Shown MD  REFERRING DIAG: M75.42 (ICD-10-CM) - Impingement syndrome of left shoulder M75.122 (ICD-10-CM) - Nontraumatic complete tear of left rotator cuff  EVAL THERAPY DIAG:  Acute pain of left shoulder  Stiffness of left shoulder, not elsewhere classified  Facial weakness  Localized edema  Cervicalgia  Rationale for Evaluation and Treatment: Rehabilitation  ONSET DATE: 09/17/22   SUBJECTIVE:  Subjective:  Pt stating he is slowly able to lift his arm a little better but it is progressing slowly. 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   PERTINENT HISTORY:  LEFT SHOULDER SCOPE, RCR, BICEPS TENODESIS, SAD, EXTENSIVE DEBRIDEMENT 09/17/22    Kidney stones, reflux, wrist surgery  PAIN:  NPRS scale: no pain at rest and with flexion movments below 90 degrees, no pain on UBE   PRECAUTIONS: Shoulder  WEIGHT BEARING RESTRICTIONS: No  FALLS:  Has patient fallen in last 6 months? No  LIVING ENVIRONMENT: Lives with: lives with their family and lives with their spouse Lives in: House/apartment Stairs: Yes: External: 3 steps; none Has following equipment at home: None  OCCUPATION:   Owns a farm  PLOF: Independent  PATIENT GOALS: Stop hurting     OBJECTIVE:   DIAGNOSTIC FINDINGS: 07/10/22  MRI of the right shoulder and cervical spine are relatively unremarkable.  The prior rotator cuff repair is intact.  He has some bulging disks with some mild to moderate foraminal narrowing but his symptoms are more consistent with MRI findings of the left shoulder which shows a full-thickness supraspinatus tear with slight retraction.  He has medial dislocation of the biceps tendon.  Unfavorable acromion and significant AC joint arthritis.  Based on these findings I have recommended arthroscopic rotator cuff repair and biceps tenodesis, debridements as indicated.   PATIENT SURVEYS:  10/06/22: FOTO intake:  37%   POSTURE: Forward head and rounded shoulder  UPPER EXTREMITY ROM:   ROM Right 10/06/22 Active supine Left 10/06/22 Passive supine Left 10/19/22 Passive  supine Left 11/03/21 Passive supine   Shoulder flexion 160 122 140 154  Shoulder extension  20    Shoulder abduction 162 86  148  Shoulder adduction      Shoulder internal rotation 64 Shoulder abd 45 deg 52 Shoulder abd 45 deg  75 deg(shoulder abd to 45)  Shoulder external rotation 75 60 73  (50 deg abdct) 78  (Shoulder abd 45 deg)  Elbow flexion 126 125    Elbow extension 0 0    Wrist flexion      Wrist extension      Wrist ulnar deviation      Wrist radial deviation      Wrist pronation      Wrist supination      (Blank rows = not tested)  CERVICAL active ROM    Cervical flexion: 25 deg Cervical extension; 18 deg with discomfort noted Cervical rotation left:  54 deg Cervical rotation right:  56 deg Cervical side bending left: 22 deg Cervical side bending right:  32 deg   11/03/21:  Cervical flexion: 30 deg Cervical extension; 26 deg with discomfort noted Cervical rotation left:  56 deg Cervical rotation right:  60 deg    UPPER EXTREMITY MMT:  MMT Right 10/06/22 Left 10/06/22  Shoulder flexion 5/5   Shoulder extension 5/5   Shoulder abduction 5/5   Shoulder adduction    Shoulder internal rotation 5/5   Shoulder external rotation 5/5   Middle trapezius    Lower trapezius    Elbow flexion    Elbow extension    Wrist flexion    Wrist extension    Wrist ulnar deviation    Wrist radial deviation    Wrist pronation    Wrist supination    Grip strength (lbs)    (Blank rows = not tested)     PALPATION:  TTP, anterior  shoulder, around incision site  TODAY'S TREATMENT:                                                                                                                            DATE: 11/05/21 TherEx UBE 6 minutes L3 Supine AA flexion with 3# bar x 2 X 10 reps Sidelying ER  2 x 10  Sidelying abd long lever short ROM X 10, short lever short ROM X10 Rows green  band x 20 Shoulder extensions green X 20 Shoulder IR red X 20 Shoulder ER yellow X 10 short ROM, X 10 isometric walk  outs Wall ladder x 10 flexion and scaption UE ranger circles in standing 20 reps each direction UE ranger flexion X 15, UE ranger abduction X 10   TODAY'S TREATMENT:                                                                                                                            DATE: 11/03/21 TherEx UBE 4 minutes each direction Level 2.5 Supine AA flexion with 3# bar x 15 reps Supine AA ER with 1# bar on Lt x 15 reps Sidelying ER  2 x 10  Shoulder isometrics 10 x 5 sec hold on Lt: Flexion Abduction ER IR Rows green  band x 15 Shoulder extensions red band x 20 holding 3 sec Wall ladder x 5 flexion and scaption Rolling red physioball up and down wall x 10 bilateral UE's  UE ranger circles in standing x 30 sec each direction  Measurements recorded  DATE: 10/29/22 TherEx UBE L2 5 min Supine AA flexion with 3# bar x 15 reps Supine AA ER with 1# bar on Lt x 15 reps AA flexion to 60-90 deg supine with RUE assist, then LUE only eccentric lowering x 15 reps AROM pendulums X 15 circles CW and CCW, X 15 abduction-adduction, X 15 flexion-extension Shoulder isometrics 15 x 5 sec hold on Lt: Flexion Abduction ER IR Rows red band x 2X10 Shoulder extensions red band 2X10    PATIENT EDUCATION: Education details: HEP, POC Person educated: Patient Education method: Consulting civil engineer, Demonstration, Verbal cues, and Handouts Education comprehension: verbalized understanding, returned demonstration, and verbal cues required  HOME EXERCISE PROGRAM: Access Code: [email or web address] URL: [email or web address] Date: 10/29/2022 Prepared by: Elsie Ra  Exercises - Seated Upper Trapezius Stretch  - 2-3 x daily - 7 x weekly - 3 reps - 10 sec hold - Supine Shoulder  Flexion with Dowel  - 2 x daily - 7 x weekly - 2 sets - 10 reps - 3-5 sec hold - Supine Shoulder External Rotation in 45 Degrees Abduction AAROM with [employer]el  - 2 x daily - 7 x weekly - 2 sets - 10 reps - Isometric  Shoulder Flexion with Ball at [employer]  - 2 x daily - 7 x weekly - 2 sets - 10 reps - 5 sec hold - Isometric Shoulder Abduction with Ball at [employer]  - 2 x daily - 7 x weekly - 2 sets - 10 reps - 5 sec hold - Isometric Shoulder External Rotation with Ball at [employer]  - 2 x daily - 7 x weekly - 2 sets - 10 reps - 5 sec hold - Standing Isometric Shoulder Internal Rotation at Wall with Ball  - 2 x daily - 7 x weekly - 2 sets - 10 reps - 5 sec hold - Standing Row with Anchored Resistance  - 2 x daily - 6 x weekly - 2-3 sets - 10 reps - Shoulder extension with resistance - Neutral  - 2 x daily - 6 x weekly - 2-3 sets - 10 reps - Shoulder Internal Rotation with Resistance (Mirrored)  - 2 x daily - 6 x weekly - 2-3 sets - 10 reps - Shoulder External Rotation with Anchored Resistance  - 2 x daily - 6 x weekly - 2-3 sets - 10 reps  ASSESSMENT:  CLINICAL IMPRESSION: Pt was encouraged to focus more at home on ER and abduction movements/exercises are these are his biggest deficits and 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 He relays understanding. PT recommending to continue with strengthening program as he still has significant shoulder weakness  OBJECTIVE IMPAIRMENTS: decreased activity tolerance, decreased mobility, decreased ROM, decreased strength, impaired UE functional use, and pain.   ACTIVITY LIMITATIONS: carrying, lifting, sleeping, and reach over head  PARTICIPATION LIMITATIONS: occupation and yard work  PERSONAL FACTORS: see pertinent medical  are also affecting patient's functional outcome.   REHAB POTENTIAL: Good  CLINICAL DECISION MAKING: Stable/uncomplicated  EVALUATION COMPLEXITY: Low   GOALS: Goals reviewed with patient? Yes  SHORT TERM GOALS: (target date for Short term goals are 3 weeks 10/30/22)  1.Patient will demonstrate independent use of home exercise program to maintain progress from in clinic treatments. Goal status: ongoing, has not done them as prescribed  but was encouraged to do so  LONG TERM GOALS: (target dates for all long term goals are 10 weeks  12/18/2022)   1. Patient will demonstrate/report pain at worst less than or equal to 2/10 to facilitate minimal limitation in daily activity secondary to pain symptoms. Goal status: ongoing   2. Patient will demonstrate independent use of home exercise program to facilitate ability to maintain/progress functional gains from skilled physical therapy services. Goal status: ongoing   3. Patient will demonstrate FOTO outcome > or = 66 % to indicate reduced disability due to condition. Goal status: ongoing   4.  Patient will demonstrate left UE MMT 5/5 throughout to facilitate lifting, reaching, carrying at [facility] in daily activity.   Goal status: ongoing 2-4/5 currently 11/03/22   5.  Patient will demonstrate left shoulder flexion >/= 150 degrees without symptoms to facilitate usual overhead reaching, self care, dressing at PLOF.    Goal status: ongoing  6.  Pt will improve left shoulder ER to >/= 65 degrees in order to facility ADL's.   Goal status: ongoing   7.  Pt will improve bilateral cervical rotation to >/= 65 degrees for improvements in functional mobility and activities of daily living.   Goal status: ongoing   PLAN:  PT FREQUENCY: 1-2x/week  PT DURATION: 10 weeks  PLANNED INTERVENTIONS: Therapeutic exercises, Therapeutic activity, Neuro Muscular re-education, Balance training, Gait training, Patient/Family education, Joint mobilization, Stair training, DME instructions, Dry Needling, Electrical stimulation, Traction, Cryotherapy, vasopneumatic device Moist heat, Taping, Ultrasound, Ionotophoresis '4mg'$ /ml Dexamethasone, and Manual therapy.  All included unless contraindicated  PLAN FOR NEXT SESSION: RTC and deltoid activation, scapular strength progressions as tolerated.    Elsie Ra, PT, DPT 11/05/22 8:10 AM

## 2022-11-09 ENCOUNTER — Encounter: Payer: Self-pay | Admitting: Physical Therapy

## 2022-11-09 ENCOUNTER — Ambulatory Visit (INDEPENDENT_AMBULATORY_CARE_PROVIDER_SITE_OTHER): Payer: 59 | Admitting: Physical Therapy

## 2022-11-09 DIAGNOSIS — M25512 Pain in left shoulder: Secondary | ICD-10-CM | POA: Diagnosis not present

## 2022-11-09 DIAGNOSIS — R600 Localized edema: Secondary | ICD-10-CM

## 2022-11-09 DIAGNOSIS — M542 Cervicalgia: Secondary | ICD-10-CM

## 2022-11-09 DIAGNOSIS — M25612 Stiffness of left shoulder, not elsewhere classified: Secondary | ICD-10-CM

## 2022-11-09 NOTE — Therapy (Signed)
OUTPATIENT PHYSICAL THERAPY TREATMENT NOTE   Patient Name: Gregory Dorsey MRN: [id number] DOB:23-Jul-1966, 57 y.o., male Today's Date: 11/09/2022  END OF SESSION:   PT End of Session - 11/09/22 0807     Visit Number 7    Number of Visits 16    Date for PT Re-Evaluation 12/04/22    Progress Note Due on Visit 10    PT Start Time 0802    PT Stop Time 0842    PT Time Calculation (min) 40 min    Activity Tolerance Patient tolerated treatment well    Behavior During Therapy [facility] for tasks assessed/performed                Past Medical History:  Diagnosis Date   Kidney stone    x2   Reflux    Past Surgical History:  Procedure Laterality Date   WRIST SURGERY Left 29 years ago   Patient Active Problem List   Diagnosis Date Noted   Nontraumatic complete tear of left rotator cuff 07/10/2022   Impingement syndrome of left shoulder 07/10/2022   Tendinopathy of left biceps tendon 07/10/2022   Traumatic tear of supraspinatus tendon of right shoulder 06/22/2019   Family history of early CAD 07/22/2018   High cholesterol 05/29/2016   Erectile dysfunction 05/14/2015   Prediabetes 01/26/2013   GERD (gastroesophageal reflux disease) 01/12/2013   NEPHROLITHIASIS, HX OF 02/13/2008     THERAPY DIAG:  Acute pain of left shoulder  Stiffness of left shoulder, not elsewhere classified  Localized edema  Cervicalgia   PCP:  Jinny Sanders, MD   REFERRING PROVIDER: Frankey Shown MD  REFERRING DIAG: M75.42 (ICD-10-CM) - Impingement syndrome of left shoulder M75.122 (ICD-10-CM) - Nontraumatic complete tear of left rotator cuff  EVAL THERAPY DIAG:  Acute pain of left shoulder  Stiffness of left shoulder, not elsewhere classified  Facial weakness  Localized edema  Cervicalgia  Rationale for Evaluation and Treatment: Rehabilitation  ONSET DATE: 09/17/22   SUBJECTIVE:  Subjective:  Pt says no pain upon arrival but his arm always stays sore and tender to the touch.  PERTINENT HISTORY:  LEFT SHOULDER SCOPE, RCR, BICEPS TENODESIS, SAD, EXTENSIVE DEBRIDEMENT 09/17/22    Kidney stones, reflux, wrist surgery  PAIN:  NPRS scale: no pain at rest and with flexion movments below 90 degrees, no pain on UBE   PRECAUTIONS: Shoulder  WEIGHT BEARING RESTRICTIONS: No  FALLS:  Has patient fallen in last 6 months? No  LIVING ENVIRONMENT: Lives with: lives with their family and lives with their spouse Lives in: House/apartment Stairs: Yes: External: 3 steps; none Has following equipment at home: None  OCCUPATION:   Owns a farm  PLOF: Independent  PATIENT GOALS: Stop hurting     OBJECTIVE:   DIAGNOSTIC FINDINGS: 07/10/22  MRI of the right shoulder and cervical spine are relatively unremarkable.  The prior rotator cuff repair is intact.  He has some bulging disks with some mild to moderate foraminal narrowing but his symptoms are more consistent with MRI findings of the left shoulder which shows a full-thickness supraspinatus tear with slight retraction.  He has medial dislocation of the biceps tendon.  Unfavorable acromion and significant AC joint arthritis.  Based on these findings I have recommended arthroscopic rotator cuff repair and biceps tenodesis, debridements as indicated.   PATIENT SURVEYS:  10/06/22: FOTO intake:  37%   POSTURE: Forward head and rounded shoulder  UPPER EXTREMITY ROM:   ROM Right 10/06/22 Active supine Left 10/06/22 Passive supine Left 10/19/22 Passive  supine Left 11/03/21 Passive supine  Shoulder flexion 160 122 140 154  Shoulder extension  20    Shoulder abduction 162  86  148  Shoulder adduction      Shoulder internal rotation 64 Shoulder abd 45 deg 52 Shoulder abd 45 deg  75 deg(shoulder abd to 45)  Shoulder external rotation 75 60 73  (50 deg abdct) 78  (Shoulder abd 45 deg)  Elbow flexion 126 125    Elbow extension 0 0    Wrist flexion      Wrist extension      Wrist ulnar deviation      Wrist radial deviation      Wrist pronation      Wrist supination      (Blank rows = not tested)  CERVICAL active ROM    Cervical flexion: 25 deg Cervical extension; 18 deg with discomfort noted Cervical rotation left:  54 deg Cervical rotation right:  56 deg Cervical side bending left: 22 deg Cervical side bending right:  32 deg   11/03/21:  Cervical flexion: 30 deg Cervical extension; 26 deg with discomfort noted Cervical rotation left:  56 deg Cervical rotation right:  60 deg    UPPER EXTREMITY MMT:  MMT Right 10/06/22 Left 1/8//23  Shoulder flexion 5/5 2+  Shoulder extension 5/5 5  Shoulder abduction 5/5 2+  Shoulder adduction    Shoulder internal rotation 5/5 4+  Shoulder external rotation 5/5 2+  Middle trapezius    Lower trapezius    Elbow flexion    Elbow extension    Wrist flexion    Wrist extension    Wrist ulnar deviation    Wrist radial deviation    Wrist pronation    Wrist supination    Grip strength (lbs)    (Blank rows = not tested)     PALPATION:  TTP, anterior shoulder, around incision site  TODAY'S TREATMENT:  DATE: 11/09/22 TherEx UBE 6 minutes L5 Shoulder flexion AAROM pball roll up wall X 15 Supine AA flexion with 3# bar x 2 X 10 reps Sidelying ER  3 x 10  Sidelying abd long lever short ROM X 10, short lever short ROM X10 Rows blue  band x 20 Shoulder extensions blueX 20 Shoulder IR green X 20 Shoulder ER yellow X 15 short ROM, isometric walk outs X15 UE ranger circles in standing  15 reps each direction, Flexion and scaption X 10 each (slow lower)  TODAY'S TREATMENT:                                                                                                                            DATE: 11/05/22 TherEx UBE 6 minutes L3 Supine AA flexion with 3# bar x 2 X 10 reps Supine chest press AROM 0# 2X10 on Lt Sidelying ER  2 x 10  Sidelying abd long lever short ROM X 10, short lever short ROM X10 Rows green  band x 20 Shoulder extensions green X 20 Shoulder IR red X 20 Shoulder ER yellow X 10 short ROM, X 10 isometric walk outs Wall ladder x 10 flexion and scaption UE ranger circles in standing 20 reps each direction UE ranger flexion X 15, UE ranger abduction X 10   TODAY'S TREATMENT:                                                                                                                            DATE: 11/03/22 TherEx UBE 4 minutes each direction Level 2.5 Supine AA flexion with 3# bar x 15 reps Supine AA ER with 1# bar on Lt x 15 reps Sidelying ER  2 x 10  Shoulder isometrics 10 x 5 sec hold on Lt: Flexion Abduction ER IR Rows green  band x 15 Shoulder extensions red band x 20 holding 3 sec Wall ladder x 5 flexion and scaption Rolling red physioball up and down wall x 10 bilateral UE's  UE ranger circles in standing x 30 sec each direction  Measurements recorded     PATIENT EDUCATION: Education details: HEP, POC Person educated: Patient Education method: Consulting civil engineer, Demonstration, Verbal cues, and Handouts Education comprehension: verbalized understanding, returned demonstration, and verbal cues required  HOME EXERCISE PROGRAM: Access Code: [email or web address] URL: [email or web address] Date: 10/29/2022 Prepared by: Elsie Ra  Exercises - Seated Upper Trapezius Stretch  - 2-3 x daily -  7 x weekly - 3 reps - 10 sec hold - Supine Shoulder Flexion with Dowel  - 2 x daily - 7 x weekly - 2 sets - 10 reps - 3-5 sec hold - Supine  Shoulder External Rotation in 45 Degrees Abduction AAROM with Dowel  - 2 x daily - 7 x weekly - 2 sets - 10 reps - Isometric Shoulder Flexion with Ball at [employer]  - 2 x daily - 7 x weekly - 2 sets - 10 reps - 5 sec hold - Isometric Shoulder Abduction with Ball at [employer]  - 2 x daily - 7 x weekly - 2 sets - 10 reps - 5 sec hold - Isometric Shoulder External Rotation with Ball at [employer]  - 2 x daily - 7 x weekly - 2 sets - 10 reps - 5 sec hold - Standing Isometric Shoulder Internal Rotation at Wall with Ball  - 2 x daily - 7 x weekly - 2 sets - 10 reps - 5 sec hold - Standing Row with Anchored Resistance  - 2 x daily - 6 x weekly - 2-3 sets - 10 reps - Shoulder extension with resistance - Neutral  - 2 x daily - 6 x weekly - 2-3 sets - 10 reps - Shoulder Internal Rotation with Resistance (Mirrored)  - 2 x daily - 6 x weekly - 2-3 sets - 10 reps - Shoulder External Rotation with Anchored Resistance  - 2 x daily - 6 x weekly - 2-3 sets - 10 reps  ASSESSMENT:  CLINICAL IMPRESSION: Strength progress has been slow up to this point. We will continue to focus on building this to his tolerance. Per patient report the MD was not able to fix his RTC tear during surgery so this might explain why his strength progression is slow.   OBJECTIVE IMPAIRMENTS: decreased activity tolerance, decreased mobility, decreased ROM, decreased strength, impaired UE functional use, and pain.   ACTIVITY LIMITATIONS: carrying, lifting, sleeping, and reach over head  PARTICIPATION LIMITATIONS: occupation and yard work  PERSONAL FACTORS: see pertinent medical  are also affecting patient's functional outcome.   REHAB POTENTIAL: Good  CLINICAL DECISION MAKING: Stable/uncomplicated  EVALUATION COMPLEXITY: Low  GOALS: Goals reviewed with patient? Yes  SHORT TERM GOALS: (target date for Short term goals are 3 weeks 10/30/22)  1.Patient will demonstrate independent use of home exercise program to maintain progress from in clinic  treatments. Goal status: ongoing, has not done them as prescribed but was encouraged to do so  LONG TERM GOALS: (target dates for all long term goals are 10 weeks  12/18/2022)   1. Patient will demonstrate/report pain at worst less than or equal to 2/10 to facilitate minimal limitation in daily activity secondary to pain symptoms. Goal status: ongoing   2. Patient will demonstrate independent use of home exercise program to facilitate ability to maintain/progress functional gains from skilled physical therapy services. Goal status: ongoing   3. Patient will demonstrate FOTO outcome > or = 66 % to indicate reduced disability due to condition. Goal status: ongoing   4.  Patient will demonstrate left UE MMT 5/5 throughout to facilitate lifting, reaching, carrying at [place] in daily activity.   Goal status: ongoing 2-4/5 currently 11/03/22   5.  Patient will demonstrate left shoulder flexion >/= 150 degrees without symptoms to facilitate usual overhead reaching, self care, dressing at PLOF.    Goal status: ongoing  6.  Pt will improve left shoulder ER to >/= 65 degrees in order  to facility ADL's.   Goal status: ongoing   7. Pt will improve bilateral cervical rotation to >/= 65 degrees for improvements in functional mobility and activities of daily living.   Goal status: ongoing     PLAN:  PT FREQUENCY: 1-2x/week  PT DURATION: 10 weeks  PLANNED INTERVENTIONS: Therapeutic exercises, Therapeutic activity, Neuro Muscular re-education, Balance training, Gait training, Patient/Family education, Joint mobilization, Stair training, DME instructions, Dry Needling, Electrical stimulation, Traction, Cryotherapy, vasopneumatic device Moist heat, Taping, Ultrasound, Ionotophoresis '4mg'$ /ml Dexamethasone, and Manual therapy.  All included unless contraindicated  PLAN FOR NEXT SESSION: RTC and deltoid activation, scapular strength progressions as tolerated.    Elsie Ra, PT, DPT 11/09/22 8:08  AM

## 2022-11-12 ENCOUNTER — Ambulatory Visit (INDEPENDENT_AMBULATORY_CARE_PROVIDER_SITE_OTHER): Payer: 59 | Admitting: Physical Therapy

## 2022-11-12 ENCOUNTER — Encounter: Payer: Self-pay | Admitting: Physical Therapy

## 2022-11-12 DIAGNOSIS — M25512 Pain in left shoulder: Secondary | ICD-10-CM

## 2022-11-12 DIAGNOSIS — R600 Localized edema: Secondary | ICD-10-CM

## 2022-11-12 DIAGNOSIS — M25612 Stiffness of left shoulder, not elsewhere classified: Secondary | ICD-10-CM

## 2022-11-12 DIAGNOSIS — M542 Cervicalgia: Secondary | ICD-10-CM

## 2022-11-12 NOTE — Therapy (Signed)
OUTPATIENT PHYSICAL THERAPY TREATMENT NOTE   Patient Name: Gregory Dorsey MRN: [id number] DOB:Aug 22, 1966, 57 y.o., male Today's Date: 11/12/2022  END OF SESSION:   PT End of Session - 11/12/22 0808     Visit Number 8    Number of Visits 16    Date for PT Re-Evaluation 12/04/22    Progress Note Due on Visit 10    PT Start Time 0807    PT Stop Time 0840    PT Time Calculation (min) 33 min    Activity Tolerance Patient tolerated treatment well    Behavior During Therapy [facility] for tasks assessed/performed                 Past Medical History:  Diagnosis Date   Kidney stone    x2   Reflux    Past Surgical History:  Procedure Laterality Date   WRIST SURGERY Left 29 years ago   Patient Active Problem List   Diagnosis Date Noted   Nontraumatic complete tear of left rotator cuff 07/10/2022   Impingement syndrome of left shoulder 07/10/2022   Tendinopathy of left biceps tendon 07/10/2022   Traumatic tear of supraspinatus tendon of right shoulder 06/22/2019   Family history of early CAD 07/22/2018   High cholesterol 05/29/2016   Erectile dysfunction 05/14/2015   Prediabetes 01/26/2013   GERD (gastroesophageal reflux disease) 01/12/2013   NEPHROLITHIASIS, HX OF 02/13/2008     THERAPY DIAG:  Acute pain of left shoulder  Stiffness of left shoulder, not elsewhere classified  Localized edema  Cervicalgia   PCP:  Jinny Sanders, MD   REFERRING PROVIDER: Frankey Shown MD  REFERRING DIAG: M75.42 (ICD-10-CM) - Impingement syndrome of left shoulder M75.122 (ICD-10-CM) - Nontraumatic complete tear of left rotator cuff  EVAL THERAPY DIAG:  Acute pain of left shoulder  Stiffness of left shoulder, not elsewhere classified  Facial weakness  Localized edema  Cervicalgia  Rationale for Evaluation and Treatment: Rehabilitation  ONSET DATE: 09/17/22   SUBJECTIVE:  Subjective:  Shoulder is doing well; occasional pain and fatigue with exercise and activity   PERTINENT HISTORY:  LEFT SHOULDER SCOPE, BICEPS TENODESIS, SAD, EXTENSIVE DEBRIDEMENT 09/17/22    Kidney stones, reflux, wrist surgery  PAIN:  NPRS scale: no pain at rest and with flexion movments below 90 degrees, no pain on UBE   PRECAUTIONS: Shoulder  WEIGHT BEARING RESTRICTIONS: No  FALLS:  Has patient fallen in last 6 months? No  LIVING ENVIRONMENT: Lives with: lives with their family and lives with their spouse Lives in: House/apartment Stairs: Yes: External: 3 steps; none Has following equipment at home: None  OCCUPATION:   Owns a farm  PLOF: Independent  PATIENT GOALS: Stop hurting     OBJECTIVE:   DIAGNOSTIC FINDINGS: 07/10/22  MRI of the right shoulder and cervical spine are relatively unremarkable.  The prior rotator cuff repair is intact.  He has some bulging disks with some mild to moderate foraminal narrowing but his symptoms are more consistent with MRI findings of the left shoulder which shows a full-thickness supraspinatus tear with slight retraction.  He has medial dislocation of the biceps tendon.  Unfavorable acromion and significant AC joint arthritis.  Based on these findings I have recommended arthroscopic rotator cuff repair and biceps tenodesis, debridements as indicated.   PATIENT SURVEYS:  10/06/22: FOTO intake:  37%   POSTURE: Forward head and rounded shoulder  UPPER EXTREMITY ROM:   ROM Right 10/06/22 Active supine Left 10/06/22 Passive supine Left 10/19/22 Passive  supine Left 11/03/21 Passive supine  Shoulder flexion 160 122 140 154  Shoulder extension  20    Shoulder abduction 162 86   148  Shoulder adduction      Shoulder internal rotation 64 Shoulder abd 45 deg 52 Shoulder abd 45 deg  75 deg(shoulder abd to 45)  Shoulder external rotation 75 60 73  (50 deg abdct) 78  (Shoulder abd 45 deg)  Elbow flexion 126 125    Elbow extension 0 0    Wrist flexion      Wrist extension      Wrist ulnar deviation      Wrist radial deviation      Wrist pronation      Wrist supination      (Blank rows = not tested)  CERVICAL active ROM    Cervical flexion: 25 deg Cervical extension; 18 deg with discomfort noted Cervical rotation left:  54 deg Cervical rotation right:  56 deg Cervical side bending left: 22 deg Cervical side bending right:  32 deg   11/03/21:  Cervical flexion: 30 deg Cervical extension; 26 deg with discomfort noted Cervical rotation left:  56 deg Cervical rotation right:  60 deg    UPPER EXTREMITY MMT:  MMT Right 10/06/22 Left 1/8//23  Shoulder flexion 5/5 2+  Shoulder extension 5/5 5  Shoulder abduction 5/5 2+  Shoulder adduction    Shoulder internal rotation 5/5 4+  Shoulder external rotation 5/5 2+  (Blank rows = not tested)     PALPATION:  TTP, anterior shoulder, around incision site  TODAY'S TREATMENT:  DATE: 11/12/22 TherEx UBE L5 x 8 min (4' each direction) Rows L4 3x10; 5 sec hold Shoulder Ext L4 band 3x10; 3 sec hold IR/ER walkout with L3 band 2x10; Lt Supine active shoulder flexion partial range with short lever arm 2x10; Lt Supine chest press AA concentric/ Lt active eccentric full range with 3# bar; 2x10 Standing partial range short lever arm Lt flexion x 20 reps (cues to decrease shrug) Seated partial range short lever arm Lt abduction x 20 reps (cues to decrease shrug)  11/09/22 TherEx UBE 6 minutes L5 Shoulder flexion AAROM pball roll up wall X 15 Supine AA flexion with 3# bar x 2 X 10  reps Sidelying ER  3 x 10  Sidelying abd long lever short ROM X 10, short lever short ROM X10 Rows blue  band x 20 Shoulder extensions blueX 20 Shoulder IR green X 20 Shoulder ER yellow X 15 short ROM, isometric walk outs X15 UE ranger circles in standing 15 reps each direction, Flexion and scaption X 10 each (slow lower)  TODAY'S TREATMENT:                                                                                                                            DATE: 11/05/22 TherEx UBE 6 minutes L3 Supine AA flexion with 3# bar x 2 X 10 reps Supine chest press AROM 0# 2X10 on Lt Sidelying ER  2 x 10  Sidelying abd long lever short ROM X 10, short lever short ROM X10 Rows green  band x 20 Shoulder extensions green X 20 Shoulder IR red X 20 Shoulder ER yellow X 10 short ROM, X 10 isometric walk outs Wall ladder x 10 flexion and scaption UE ranger circles in standing 20 reps each direction UE ranger flexion X 15, UE ranger abduction X 10   TODAY'S TREATMENT:                                                                                                                            DATE: 11/03/22 TherEx UBE 4 minutes each direction Level 2.5 Supine AA flexion with 3# bar x 15 reps Supine AA ER with 1# bar on Lt x 15 reps Sidelying ER  2 x 10  Shoulder isometrics 10 x 5 sec hold on Lt: Flexion Abduction ER IR Rows green  band x 15 Shoulder extensions red band x 20 holding 3 sec Wall ladder x  5 flexion and scaption Rolling red physioball up and down wall x 10 bilateral UE's  UE ranger circles in standing x 30 sec each direction  Measurements recorded     PATIENT EDUCATION: Education details: HEP, POC Person educated: Patient Education method: Consulting civil engineer, Demonstration, Verbal cues, and Handouts Education comprehension: verbalized understanding, returned demonstration, and verbal cues required  HOME EXERCISE PROGRAM: Access Code: [email or web address] URL:  [email or web address] Date: 10/29/2022 Prepared by: Elsie Ra  Exercises - Seated Upper Trapezius Stretch  - 2-3 x daily - 7 x weekly - 3 reps - 10 sec hold - Supine Shoulder Flexion with Dowel  - 2 x daily - 7 x weekly - 2 sets - 10 reps - 3-5 sec hold - Supine Shoulder External Rotation in 45 Degrees Abduction AAROM with Dowel  - 2 x daily - 7 x weekly - 2 sets - 10 reps - Isometric Shoulder Flexion with Ball at [employer]  - 2 x daily - 7 x weekly - 2 sets - 10 reps - 5 sec hold - Isometric Shoulder Abduction with Ball at [employer]  - 2 x daily - 7 x weekly - 2 sets - 10 reps - 5 sec hold - Isometric Shoulder External Rotation with Ball at [employer]  - 2 x daily - 7 x weekly - 2 sets - 10 reps - 5 sec hold - Standing Isometric Shoulder Internal Rotation at Wall with Ball  - 2 x daily - 7 x weekly - 2 sets - 10 reps - 5 sec hold - Standing Row with Anchored Resistance  - 2 x daily - 6 x weekly - 2-3 sets - 10 reps - Shoulder extension with resistance - Neutral  - 2 x daily - 6 x weekly - 2-3 sets - 10 reps - Shoulder Internal Rotation with Resistance (Mirrored)  - 2 x daily - 6 x weekly - 2-3 sets - 10 reps - Shoulder External Rotation with Anchored Resistance  - 2 x daily - 6 x weekly - 2-3 sets - 10 reps  ASSESSMENT:  CLINICAL IMPRESSION: Continued work on strengthening without compensation as strengthening is slow to progress.  Will continue to benefit from PT to maximize function.  OBJECTIVE IMPAIRMENTS: decreased activity tolerance, decreased mobility, decreased ROM, decreased strength, impaired UE functional use, and pain.   ACTIVITY LIMITATIONS: carrying, lifting, sleeping, and reach over head  PARTICIPATION LIMITATIONS: occupation and yard work  PERSONAL FACTORS: see pertinent medical  are also affecting patient's functional outcome.   REHAB POTENTIAL: Good  CLINICAL DECISION MAKING: Stable/uncomplicated  EVALUATION COMPLEXITY: Low   GOALS: Goals reviewed with  patient? Yes  SHORT TERM GOALS: (target date for Short term goals are 3 weeks 10/30/22)  1.Patient will demonstrate independent use of home exercise program to maintain progress from in clinic treatments. Goal status: New  LONG TERM GOALS: (target dates for all long term goals are 10 weeks  12/18/2022)   1. Patient will demonstrate/report pain at worst less than or equal to 2/10 to facilitate minimal limitation in daily activity secondary to pain symptoms. Goal status: New   2. Patient will demonstrate independent use of home exercise program to facilitate ability to maintain/progress functional gains from skilled physical therapy services. Goal status: New   3. Patient will demonstrate FOTO outcome > or = 66 % to indicate reduced disability due to condition. Goal status: New   4.  Patient will demonstrate left UE MMT 5/5 throughout to facilitate lifting, reaching, carrying at [facility] in daily activity.  Goal status: New   5.  Patient will demonstrate left shoulder flexion >/= 150 degrees without symptoms to facilitate usual overhead reaching, self care, dressing at PLOF.    Goal status: New   6.  Pt will improve left shoulder ER to >/= 65 degrees in order to facility ADL's.   Goal status: New   7. Pt will improve bilateral cervical rotation to >/= 65 degrees for improvements in functional mobility and activities of daily living.   Goal status: New     PLAN:  PT FREQUENCY: 1-2x/week  PT DURATION: 10 weeks  PLANNED INTERVENTIONS: Therapeutic exercises, Therapeutic activity, Neuro Muscular re-education, Balance training, Gait training, Patient/Family education, Joint mobilization, Stair training, DME instructions, Dry Needling, Electrical stimulation, Traction, Cryotherapy, vasopneumatic device Moist heat, Taping, Ultrasound, Ionotophoresis '4mg'$ /ml Dexamethasone, and Manual therapy.  All included unless contraindicated  PLAN FOR NEXT SESSION: RTC and deltoid activation, scapular  strength progressions as tolerated.    Laureen Abrahams, PT, DPT 11/12/22 8:44 AM

## 2022-11-16 ENCOUNTER — Ambulatory Visit (INDEPENDENT_AMBULATORY_CARE_PROVIDER_SITE_OTHER): Payer: 59 | Admitting: Physical Therapy

## 2022-11-16 ENCOUNTER — Encounter: Payer: Self-pay | Admitting: Physical Therapy

## 2022-11-16 DIAGNOSIS — M25512 Pain in left shoulder: Secondary | ICD-10-CM

## 2022-11-16 DIAGNOSIS — M542 Cervicalgia: Secondary | ICD-10-CM | POA: Diagnosis not present

## 2022-11-16 DIAGNOSIS — R600 Localized edema: Secondary | ICD-10-CM

## 2022-11-16 DIAGNOSIS — M25612 Stiffness of left shoulder, not elsewhere classified: Secondary | ICD-10-CM

## 2022-11-16 NOTE — Therapy (Signed)
OUTPATIENT PHYSICAL THERAPY TREATMENT NOTE   Patient Name: Gregory Dorsey MRN: [id number] DOB:12-20-1965, 57 y.o., male Today's Date: 11/16/2022  END OF SESSION:   PT End of Session - 11/16/22 0800     Visit Number 9    Number of Visits 16    Date for PT Re-Evaluation 12/04/22    Progress Note Due on Visit 10    PT Start Time 0804    PT Stop Time 0845    PT Time Calculation (min) 41 min    Activity Tolerance Patient tolerated treatment well    Behavior During Therapy [facility] for tasks assessed/performed                 Past Medical History:  Diagnosis Date   Kidney stone    x2   Reflux    Past Surgical History:  Procedure Laterality Date   WRIST SURGERY Left 29 years ago   Patient Active Problem List   Diagnosis Date Noted   Nontraumatic complete tear of left rotator cuff 07/10/2022   Impingement syndrome of left shoulder 07/10/2022   Tendinopathy of left biceps tendon 07/10/2022   Traumatic tear of supraspinatus tendon of right shoulder 06/22/2019   Family history of early CAD 07/22/2018   High cholesterol 05/29/2016   Erectile dysfunction 05/14/2015   Prediabetes 01/26/2013   GERD (gastroesophageal reflux disease) 01/12/2013   NEPHROLITHIASIS, HX OF 02/13/2008     THERAPY DIAG:  Acute pain of left shoulder  Stiffness of left shoulder, not elsewhere classified  Localized edema  Cervicalgia   PCP:  Jinny Sanders, MD   REFERRING PROVIDER: Frankey Shown MD  REFERRING DIAG: M75.42 (ICD-10-CM) - Impingement syndrome of left shoulder M75.122 (ICD-10-CM) - Nontraumatic complete tear of left rotator cuff  EVAL THERAPY DIAG:  Acute pain of left shoulder  Stiffness of left shoulder, not elsewhere classified  Facial weakness  Localized edema  Cervicalgia  Rationale for Evaluation and Treatment: Rehabilitation  ONSET DATE: 09/17/22   SUBJECTIVE:  Subjective:  Shoulder does not have pain at rest but continues to be painful and pops when he uses it, he still cannot lift his arm yet and he is frustrated by this  PERTINENT HISTORY:  LEFT SHOULDER SCOPE, BICEPS TENODESIS, SAD, EXTENSIVE DEBRIDEMENT 09/17/22    Kidney stones, reflux, wrist surgery  PAIN:  NPRS scale: no pain at rest and with flexion movments below 90 degrees, no pain on UBE   PRECAUTIONS: Shoulder  WEIGHT BEARING RESTRICTIONS: No  FALLS:  Has patient fallen in last 6 months? No  LIVING ENVIRONMENT: Lives with: lives with their family and lives with their spouse Lives in: House/apartment Stairs: Yes: External: 3 steps; none Has following equipment at home: None  OCCUPATION:   Owns a farm  PLOF: Independent  PATIENT GOALS: Stop hurting     OBJECTIVE:   DIAGNOSTIC FINDINGS: 07/10/22  MRI of the right shoulder and cervical spine are relatively unremarkable.  The prior rotator cuff repair is intact.  He has some bulging disks with some mild to moderate foraminal narrowing but his symptoms are more consistent with MRI findings of the left shoulder which shows a full-thickness supraspinatus tear with slight retraction.  He has medial dislocation of the biceps tendon.  Unfavorable acromion and significant AC joint arthritis.  Based on these findings I have recommended arthroscopic rotator cuff repair and biceps tenodesis, debridements as indicated.   PATIENT SURVEYS:  10/06/22: FOTO intake:  37%   POSTURE: Forward head and rounded shoulder  UPPER EXTREMITY ROM:   ROM Right 10/06/22 Active supine Left 10/06/22 Passive supine Left 10/19/22 Passive  supine Left 11/03/21 Passive supine  Left 11/16/22 AROM/PROM In  standing  Shoulder flexion 160 122 140 154 50/WNL  Shoulder extension  20     Shoulder abduction 162 86  148 50/WNL  Shoulder adduction       Shoulder internal rotation 64 Shoulder abd 45 deg 52 Shoulder abd 45 deg  75 deg(shoulder abd to 45)   Shoulder external rotation 75 60 73  (50 deg abdct) 78  (Shoulder abd 45 deg) 65/WNL  Elbow flexion 126 125     Elbow extension 0 0     Wrist flexion       Wrist extension       Wrist ulnar deviation       Wrist radial deviation       Wrist pronation       Wrist supination       (Blank rows = not tested)  CERVICAL active ROM    Cervical flexion: 25 deg Cervical extension; 18 deg with discomfort noted Cervical rotation left:  54 deg Cervical rotation right:  56 deg Cervical side bending left: 22 deg Cervical side bending right:  32 deg   11/03/21:  Cervical flexion: 30 deg Cervical extension; 26 deg with discomfort noted Cervical rotation left:  56 deg Cervical rotation right:  60 deg    UPPER EXTREMITY MMT:  MMT Right 10/06/22 Left 1/8//23 Left 11/16/22  Shoulder flexion 5/5 2+ 2+  Shoulder extension 5/5 5   Shoulder abduction 5/5 2+ 2+  Shoulder adduction     Shoulder internal rotation 5/5 4+   Shoulder external rotation 5/5 2+ 3  (Blank rows = not tested)     PALPATION:  TTP, anterior shoulder, around incision site  TODAY'S TREATMENT:  DATE: 11/16/22 -UE ranger AAROM X 15 circles, flexion, abduction -NMES/Russian stimulation 5/5 sec on/off with intensity to tolerance X 15 min while he performs active abduction (5 min in sitting) , flexion (5 min in supine) ER (5 min  during on phase -Rows blue X 20 -Extensions blue X 20 -IR green X 20 -ER yellow X 15, then isometric step outs X 15 -Wall ladder X 5 flexion, X 5 abduction with slow eccentric  lower  11/12/22 TherEx UBE L5 x 8 min (4' each direction) Rows L4 3x10; 5 sec hold Shoulder Ext L4 band 3x10; 3 sec hold IR/ER walkout with L3 band 2x10; Lt Supine active shoulder flexion partial range with short lever arm 2x10; Lt Supine chest press AA concentric/ Lt active eccentric full range with 3# bar; 2x10 Standing partial range short lever arm Lt flexion x 20 reps (cues to decrease shrug) Seated partial range short lever arm Lt abduction x 20 reps (cues to decrease shrug)  11/09/22 TherEx UBE 6 minutes L5 Shoulder flexion AAROM pball roll up wall X 15 Supine AA flexion with 3# bar x 2 X 10 reps Sidelying ER  3 x 10  Sidelying abd long lever short ROM X 10, short lever short ROM X10 Rows blue  band x 20 Shoulder extensions blueX 20 Shoulder IR green X 20 Shoulder ER yellow X 15 short ROM, isometric walk outs X15 UE ranger circles in standing 15 reps each direction, Flexion and scaption X 10 each (slow lower)  TODAY'S TREATMENT:                                                                                                                            DATE: 11/05/22 TherEx UBE 6 minutes L3 Supine AA flexion with 3# bar x 2 X 10 reps Supine chest press AROM 0# 2X10 on Lt Sidelying ER  2 x 10  Sidelying abd long lever short ROM X 10, short lever short ROM X10 Rows green  band x 20 Shoulder extensions green X 20 Shoulder IR red X 20 Shoulder ER yellow X 10 short ROM, X 10 isometric walk outs Wall ladder x 10 flexion and scaption UE ranger circles in standing 20 reps each direction UE ranger flexion X 15, UE ranger abduction X 10     PATIENT EDUCATION: Education details: HEP, POC Person educated: Patient Education method: Consulting civil engineer, Demonstration, Verbal cues, and Handouts Education comprehension: verbalized understanding, returned demonstration, and verbal cues required  HOME EXERCISE PROGRAM: Access Code: [email or web address] URL: [email or web address] Date:  10/29/2022 Prepared by: Elsie Ra  Exercises - Seated Upper Trapezius Stretch  - 2-3 x daily - 7 x weekly - 3 reps - 10 sec hold - Supine Shoulder Flexion with Dowel  - 2 x daily - 7 x weekly - 2 sets - 10 reps - 3-5 sec hold - Supine Shoulder External Rotation in 45 Degrees Abduction AAROM with Dowel  - 2 x daily - 7 x weekly -  2 sets - 10 reps - Isometric Shoulder Flexion with Ball at [employer]  - 2 x daily - 7 x weekly - 2 sets - 10 reps - 5 sec hold - Isometric Shoulder Abduction with Ball at [employer]  - 2 x daily - 7 x weekly - 2 sets - 10 reps - 5 sec hold - Isometric Shoulder External Rotation with Ball at [employer]  - 2 x daily - 7 x weekly - 2 sets - 10 reps - 5 sec hold - Standing Isometric Shoulder Internal Rotation at Wall with Ball  - 2 x daily - 7 x weekly - 2 sets - 10 reps - 5 sec hold - Standing Row with Anchored Resistance  - 2 x daily - 6 x weekly - 2-3 sets - 10 reps - Shoulder extension with resistance - Neutral  - 2 x daily - 6 x weekly - 2-3 sets - 10 reps - Shoulder Internal Rotation with Resistance (Mirrored)  - 2 x daily - 6 x weekly - 2-3 sets - 10 reps - Shoulder External Rotation with Anchored Resistance  - 2 x daily - 6 x weekly - 2-3 sets - 10 reps  ASSESSMENT:  CLINICAL IMPRESSION: He continues to lack strength needed to lift his arm against gravity. He was intrested in NMES/Russian stimulation so we trialed this today to help facilitate active movements and strength of his deltoids and RTC. With NMES he was at least able to raise his arm into flexion in supine position unassisted which he has not yet been able to do.   OBJECTIVE IMPAIRMENTS: decreased activity tolerance, decreased mobility, decreased ROM, decreased strength, impaired UE functional use, and pain.   ACTIVITY LIMITATIONS: carrying, lifting, sleeping, and reach over head  PARTICIPATION LIMITATIONS: occupation and yard work  PERSONAL FACTORS: see pertinent medical  are also affecting patient's functional  outcome.   REHAB POTENTIAL: Good  CLINICAL DECISION MAKING: Stable/uncomplicated  EVALUATION COMPLEXITY: Low   GOALS: Goals reviewed with patient? Yes  SHORT TERM GOALS: (target date for Short term goals are 3 weeks 10/30/22)  1.Patient will demonstrate independent use of home exercise program to maintain progress from in clinic treatments. Goal status: New  LONG TERM GOALS: (target dates for all long term goals are 10 weeks  12/18/2022)   1. Patient will demonstrate/report pain at worst less than or equal to 2/10 to facilitate minimal limitation in daily activity secondary to pain symptoms. Goal status: New   2. Patient will demonstrate independent use of home exercise program to facilitate ability to maintain/progress functional gains from skilled physical therapy services. Goal status: New   3. Patient will demonstrate FOTO outcome > or = 66 % to indicate reduced disability due to condition. Goal status: New   4.  Patient will demonstrate left UE MMT 5/5 throughout to facilitate lifting, reaching, carrying at [facility] in daily activity.   Goal status: New   5.  Patient will demonstrate left shoulder flexion >/= 150 degrees without symptoms to facilitate usual overhead reaching, self care, dressing at PLOF.    Goal status: New   6.  Pt will improve left shoulder ER to >/= 65 degrees in order to facility ADL's.   Goal status: New   7. Pt will improve bilateral cervical rotation to >/= 65 degrees for improvements in functional mobility and activities of daily living.   Goal status: New     PLAN:  PT FREQUENCY: 1-2x/week  PT DURATION: 10 weeks  PLANNED INTERVENTIONS: Therapeutic exercises,  Therapeutic activity, Neuro Muscular re-education, Balance training, Gait training, Patient/Family education, Joint mobilization, Stair training, DME instructions, Dry Needling, Electrical stimulation, Traction, Cryotherapy, vasopneumatic device Moist heat, Taping, Ultrasound,  Ionotophoresis '4mg'$ /ml Dexamethasone, and Manual therapy.  All included unless contraindicated  PLAN FOR NEXT SESSION: NMES for RTC and deltoid activation, scapular strength progressions as tolerated.    Elsie Ra, PT, DPT 11/16/22 8:16 AM

## 2022-11-19 ENCOUNTER — Ambulatory Visit (INDEPENDENT_AMBULATORY_CARE_PROVIDER_SITE_OTHER): Payer: 59 | Admitting: Physical Therapy

## 2022-11-19 DIAGNOSIS — M25612 Stiffness of left shoulder, not elsewhere classified: Secondary | ICD-10-CM

## 2022-11-19 DIAGNOSIS — R600 Localized edema: Secondary | ICD-10-CM | POA: Diagnosis not present

## 2022-11-19 DIAGNOSIS — M25512 Pain in left shoulder: Secondary | ICD-10-CM

## 2022-11-19 DIAGNOSIS — M542 Cervicalgia: Secondary | ICD-10-CM | POA: Diagnosis not present

## 2022-11-19 NOTE — Therapy (Signed)
OUTPATIENT PHYSICAL THERAPY TREATMENT NOTE   Patient Name: Gregory Dorsey MRN: [id number] DOB:Oct 14, 1966, 57 y.o., male Today's Date: 11/19/2022  END OF SESSION:   PT End of Session - 11/19/22 0848     Visit Number 10    Number of Visits 16    Date for PT Re-Evaluation 12/04/22    PT Start Time 0804    PT Stop Time 0845    PT Time Calculation (min) 41 min    Activity Tolerance Patient tolerated treatment well    Behavior During Therapy [facility] for tasks assessed/performed                  Past Medical History:  Diagnosis Date   Kidney stone    x2   Reflux    Past Surgical History:  Procedure Laterality Date   WRIST SURGERY Left 29 years ago   Patient Active Problem List   Diagnosis Date Noted   Nontraumatic complete tear of left rotator cuff 07/10/2022   Impingement syndrome of left shoulder 07/10/2022   Tendinopathy of left biceps tendon 07/10/2022   Traumatic tear of supraspinatus tendon of right shoulder 06/22/2019   Family history of early CAD 07/22/2018   High cholesterol 05/29/2016   Erectile dysfunction 05/14/2015   Prediabetes 01/26/2013   GERD (gastroesophageal reflux disease) 01/12/2013   NEPHROLITHIASIS, HX OF 02/13/2008     THERAPY DIAG:  Acute pain of left shoulder  Stiffness of left shoulder, not elsewhere classified  Localized edema  Cervicalgia   PCP:  Jinny Sanders, MD   REFERRING PROVIDER: Frankey Shown MD  REFERRING DIAG: M75.42 (ICD-10-CM) - Impingement syndrome of left shoulder M75.122 (ICD-10-CM) - Nontraumatic complete tear of left rotator cuff  EVAL THERAPY DIAG:  Acute pain of left shoulder  Stiffness of left shoulder, not elsewhere classified  Facial weakness  Localized edema  Cervicalgia  Rationale for Evaluation and Treatment: Rehabilitation  ONSET DATE: 09/17/22   SUBJECTIVE:  Subjective:  Shoulder is about the same, did not have too much soreness after NMES treatment last time  PERTINENT HISTORY:  LEFT SHOULDER SCOPE, BICEPS TENODESIS, SAD, EXTENSIVE DEBRIDEMENT 09/17/22    Kidney stones, reflux, wrist surgery  PAIN:  NPRS scale: no pain at rest and with flexion movments below 90 degrees, no pain on UBE   PRECAUTIONS: Shoulder  WEIGHT BEARING RESTRICTIONS: No  FALLS:  Has patient fallen in last 6 months? No  LIVING ENVIRONMENT: Lives with: lives with their family and lives with their spouse Lives in: House/apartment Stairs: Yes: External: 3 steps; none Has following equipment at home: None  OCCUPATION:   Owns a farm  PLOF: Independent  PATIENT GOALS: Stop hurting     OBJECTIVE:   DIAGNOSTIC FINDINGS: 07/10/22  MRI of the right shoulder and cervical spine are relatively unremarkable.  The prior rotator cuff repair is intact.  He has some bulging disks with some mild to moderate foraminal narrowing but his symptoms are more consistent with MRI findings of the left shoulder which shows a full-thickness supraspinatus tear with slight retraction.  He has medial dislocation of the biceps tendon.  Unfavorable acromion and significant AC joint arthritis.  Based on these findings I have recommended arthroscopic rotator cuff repair and biceps tenodesis, debridements as indicated.   PATIENT SURVEYS:  10/06/22: FOTO intake:  37%   POSTURE: Forward head and rounded shoulder  UPPER EXTREMITY ROM:   ROM Right 10/06/22 Active supine Left 10/06/22 Passive supine Left 10/19/22 Passive  supine Left 11/03/21 Passive supine Left 11/16/22 AROM/PROM In  standing  Shoulder flexion 160 122 140 154 50/WNL  Shoulder extension  20      Shoulder abduction 162 86  148 50/WNL  Shoulder adduction       Shoulder internal rotation 64 Shoulder abd 45 deg 52 Shoulder abd 45 deg  75 deg(shoulder abd to 45)   Shoulder external rotation 75 60 73  (50 deg abdct) 78  (Shoulder abd 45 deg) 65/WNL  Elbow flexion 126 125     Elbow extension 0 0     Wrist flexion       Wrist extension       Wrist ulnar deviation       Wrist radial deviation       Wrist pronation       Wrist supination       (Blank rows = not tested)  CERVICAL active ROM    Cervical flexion: 25 deg Cervical extension; 18 deg with discomfort noted Cervical rotation left:  54 deg Cervical rotation right:  56 deg Cervical side bending left: 22 deg Cervical side bending right:  32 deg   11/03/21:  Cervical flexion: 30 deg Cervical extension; 26 deg with discomfort noted Cervical rotation left:  56 deg Cervical rotation right:  60 deg    UPPER EXTREMITY MMT:  MMT Right 10/06/22 Left 1/8//23 Left 11/16/22  Shoulder flexion 5/5 2+ 2+  Shoulder extension 5/5 5   Shoulder abduction 5/5 2+ 2+  Shoulder adduction     Shoulder internal rotation 5/5 4+   Shoulder external rotation 5/5 2+ 3  (Blank rows = not tested)     PALPATION:  TTP, anterior shoulder, around incision site  TODAY'S TREATMENT:  DATE: 11/19/22 -UE ranger AAROM X 15 circles, flexion, abduction -NMES/Russian stimulation 5/5 sec on/off with intensity to tolerance X 14 min while he performs active abduction (7 min in sidelying) , flexion (6 min in supine, 1 min sitting)  during on phase -Rows blue X 20 -Extensions blue X 20 -IR green X 20 -ER yellow X 15, then isometric step outs X 15 -Wall push ups X 20  11/16/22 -UE ranger AAROM X 15 circles, flexion, abduction -NMES/Russian stimulation 5/5 sec on/off with intensity to tolerance X 15 min while he performs active  abduction (5 min in sitting) , flexion (5 min in supine) ER (5 min  during on phase -Rows blue X 20 -Extensions blue X 20 -IR green X 20 -ER yellow X 15, then isometric step outs X 15 -Wall ladder X 5 flexion, X 5 abduction with slow eccentric lower -Seated short lever shoulder abduction  X10  11/12/22 TherEx UBE L5 x 8 min (4' each direction) Rows L4 3x10; 5 sec hold Shoulder Ext L4 band 3x10; 3 sec hold IR/ER walkout with L3 band 2x10; Lt Supine active shoulder flexion partial range with short lever arm 2x10; Lt Supine chest press AA concentric/ Lt active eccentric full range with 3# bar; 2x10 Standing partial range short lever arm Lt flexion x 20 reps (cues to decrease shrug) Seated partial range short lever arm Lt abduction x 20 reps (cues to decrease shrug)  11/09/22 TherEx UBE 6 minutes L5 Shoulder flexion AAROM pball roll up wall X 15 Supine AA flexion with 3# bar x 2 X 10 reps Sidelying ER  3 x 10  Sidelying abd long lever short ROM X 10, short lever short ROM X10 Rows blue  band x 20 Shoulder extensions blueX 20 Shoulder IR green X 20 Shoulder ER yellow X 15 short ROM, isometric walk outs X15 UE ranger circles in standing 15 reps each direction, Flexion and scaption X 10 each (slow lower)  TODAY'S TREATMENT:                                                                                                                            DATE: 11/05/22 TherEx UBE 6 minutes L3 Supine AA flexion with 3# bar x 2 X 10 reps Supine chest press AROM 0# 2X10 on Lt Sidelying ER  2 x 10  Sidelying abd long lever short ROM X 10, short lever short ROM X10 Rows green  band x 20 Shoulder extensions green X 20 Shoulder IR red X 20 Shoulder ER yellow X 10 short ROM, X 10 isometric walk outs Wall ladder x 10 flexion and scaption UE ranger circles in standing 20 reps each direction UE ranger flexion X 15, UE ranger abduction X 10     PATIENT EDUCATION: Education details: HEP,  POC Person educated: Patient Education method: Consulting civil engineer, Demonstration, Verbal cues, and Handouts Education comprehension: verbalized understanding, returned demonstration, and verbal cues required  HOME EXERCISE PROGRAM: Access Code: [email or web address]  URL: [email or web address] Date: 10/29/2022 Prepared by: Elsie Ra  Exercises - Seated Upper Trapezius Stretch  - 2-3 x daily - 7 x weekly - 3 reps - 10 sec hold - Supine Shoulder Flexion with Dowel  - 2 x daily - 7 x weekly - 2 sets - 10 reps - 3-5 sec hold - Supine Shoulder External Rotation in 45 Degrees Abduction AAROM with Dowel  - 2 x daily - 7 x weekly - 2 sets - 10 reps - Isometric Shoulder Flexion with Ball at [employer]  - 2 x daily - 7 x weekly - 2 sets - 10 reps - 5 sec hold - Isometric Shoulder Abduction with Ball at [employer]  - 2 x daily - 7 x weekly - 2 sets - 10 reps - 5 sec hold - Isometric Shoulder External Rotation with Ball at [employer]  - 2 x daily - 7 x weekly - 2 sets - 10 reps - 5 sec hold - Standing Isometric Shoulder Internal Rotation at Wall with Ball  - 2 x daily - 7 x weekly - 2 sets - 10 reps - 5 sec hold - Standing Row with Anchored Resistance  - 2 x daily - 6 x weekly - 2-3 sets - 10 reps - Shoulder extension with resistance - Neutral  - 2 x daily - 6 x weekly - 2-3 sets - 10 reps - Shoulder Internal Rotation with Resistance (Mirrored)  - 2 x daily - 6 x weekly - 2-3 sets - 10 reps - Shoulder External Rotation with Anchored Resistance  - 2 x daily - 6 x weekly - 2-3 sets - 10 reps  ASSESSMENT:  CLINICAL IMPRESSION: He was able to raise his arm against gravity reduced positions in sidelying and supine after a few minutes of NMES. PT recommending to continue with deltoid activation and strength focus d   OBJECTIVE IMPAIRMENTS: decreased activity tolerance, decreased mobility, decreased ROM, decreased strength, impaired UE functional use, and pain.   ACTIVITY LIMITATIONS: carrying, lifting, sleeping, and reach  over head  PARTICIPATION LIMITATIONS: occupation and yard work  PERSONAL FACTORS: see pertinent medical  are also affecting patient's functional outcome.   REHAB POTENTIAL: Good  CLINICAL DECISION MAKING: Stable/uncomplicated  EVALUATION COMPLEXITY: Low   GOALS: Goals reviewed with patient? Yes  SHORT TERM GOALS: (target date for Short term goals are 3 weeks 10/30/22)  1.Patient will demonstrate independent use of home exercise program to maintain progress from in clinic treatments. Goal status: New  LONG TERM GOALS: (target dates for all long term goals are 10 weeks  12/18/2022)   1. Patient will demonstrate/report pain at worst less than or equal to 2/10 to facilitate minimal limitation in daily activity secondary to pain symptoms. Goal status: New   2. Patient will demonstrate independent use of home exercise program to facilitate ability to maintain/progress functional gains from skilled physical therapy services. Goal status: New   3. Patient will demonstrate FOTO outcome > or = 66 % to indicate reduced disability due to condition. Goal status: New   4.  Patient will demonstrate left UE MMT 5/5 throughout to facilitate lifting, reaching, carrying at [facility] in daily activity.   Goal status: New   5.  Patient will demonstrate left shoulder flexion >/= 150 degrees without symptoms to facilitate usual overhead reaching, self care, dressing at PLOF.    Goal status: New   6.  Pt will improve left shoulder ER to >/= 65 degrees in order to facility ADL's.   Goal  status: New   7. Pt will improve bilateral cervical rotation to >/= 65 degrees for improvements in functional mobility and activities of daily living.   Goal status: New     PLAN:  PT FREQUENCY: 1-2x/week  PT DURATION: 10 weeks  PLANNED INTERVENTIONS: Therapeutic exercises, Therapeutic activity, Neuro Muscular re-education, Balance training, Gait training, Patient/Family education, Joint mobilization, Stair  training, DME instructions, Dry Needling, Electrical stimulation, Traction, Cryotherapy, vasopneumatic device Moist heat, Taping, Ultrasound, Ionotophoresis '4mg'$ /ml Dexamethasone, and Manual therapy.  All included unless contraindicated  PLAN FOR NEXT SESSION: NMES for RTC and deltoid activation, scapular strength progressions as tolerated.    Elsie Ra, PT, DPT 11/19/22 8:49 AM

## 2022-11-23 ENCOUNTER — Encounter: Payer: Self-pay | Admitting: Physical Therapy

## 2022-11-23 ENCOUNTER — Ambulatory Visit (INDEPENDENT_AMBULATORY_CARE_PROVIDER_SITE_OTHER): Payer: 59 | Admitting: Physical Therapy

## 2022-11-23 DIAGNOSIS — M25612 Stiffness of left shoulder, not elsewhere classified: Secondary | ICD-10-CM

## 2022-11-23 DIAGNOSIS — M25512 Pain in left shoulder: Secondary | ICD-10-CM | POA: Diagnosis not present

## 2022-11-23 DIAGNOSIS — R600 Localized edema: Secondary | ICD-10-CM

## 2022-11-23 NOTE — Therapy (Signed)
OUTPATIENT PHYSICAL THERAPY TREATMENT NOTE   Patient Name: Gregory Dorsey MRN: [id number] DOB:1966-02-17, 57 y.o., male Today's Date: 11/23/2022  END OF SESSION:   PT End of Session - 11/23/22 0814     Visit Number 11    Number of Visits 16    Date for PT Re-Evaluation 12/04/22    PT Start Time 0800    PT Stop Time 0841    PT Time Calculation (min) 41 min    Activity Tolerance Patient tolerated treatment well    Behavior During Therapy [facility] for tasks assessed/performed                   Past Medical History:  Diagnosis Date   Kidney stone    x2   Reflux    Past Surgical History:  Procedure Laterality Date   WRIST SURGERY Left 29 years ago   Patient Active Problem List   Diagnosis Date Noted   Nontraumatic complete tear of left rotator cuff 07/10/2022   Impingement syndrome of left shoulder 07/10/2022   Tendinopathy of left biceps tendon 07/10/2022   Traumatic tear of supraspinatus tendon of right shoulder 06/22/2019   Family history of early CAD 07/22/2018   High cholesterol 05/29/2016   Erectile dysfunction 05/14/2015   Prediabetes 01/26/2013   GERD (gastroesophageal reflux disease) 01/12/2013   NEPHROLITHIASIS, HX OF 02/13/2008     THERAPY DIAG:  Acute pain of left shoulder  Stiffness of left shoulder, not elsewhere classified  Localized edema   PCP:  Jinny Sanders, MD   REFERRING PROVIDER: Frankey Shown MD  REFERRING DIAG: M75.42 (ICD-10-CM) - Impingement syndrome of left shoulder M75.122 (ICD-10-CM) - Nontraumatic complete tear of left rotator cuff  EVAL THERAPY DIAG:  Acute pain of left shoulder  Stiffness of left shoulder, not elsewhere classified  Facial weakness  Localized edema  Cervicalgia  Rationale for Evaluation and Treatment: Rehabilitation  ONSET DATE: 09/17/22   SUBJECTIVE:  Subjective: Thinks he can move his shoulder a little more, still frustrated with limited progress   PERTINENT HISTORY:  LEFT SHOULDER SCOPE, BICEPS TENODESIS, SAD, EXTENSIVE DEBRIDEMENT 09/17/22    Kidney stones, reflux, wrist surgery  PAIN:  NPRS scale: no pain at rest and with flexion movments below 90 degrees, no pain on UBE   PRECAUTIONS: Shoulder  WEIGHT BEARING RESTRICTIONS: No  FALLS:  Has patient fallen in last 6 months? No  LIVING ENVIRONMENT: Lives with: lives with their family and lives with their spouse Lives in: House/apartment Stairs: Yes: External: 3 steps; none Has following equipment at home: None  OCCUPATION:   Owns a farm  PLOF: Independent  PATIENT GOALS: Stop hurting     OBJECTIVE:   DIAGNOSTIC FINDINGS: 07/10/22  MRI of the right shoulder and cervical spine are relatively unremarkable.  The prior rotator cuff repair is intact.  He has some bulging disks with some mild to moderate foraminal narrowing but his symptoms are more consistent with MRI findings of the left shoulder which shows a full-thickness supraspinatus tear with slight retraction.  He has medial dislocation of the biceps tendon.  Unfavorable acromion and significant AC joint arthritis.  Based on these findings I have recommended arthroscopic rotator cuff repair and biceps tenodesis, debridements as indicated.   PATIENT SURVEYS:  10/06/22: FOTO intake:  37%  11/23/22: FOTO 45  POSTURE: Forward head and rounded shoulder  UPPER EXTREMITY ROM:   ROM Right 10/06/22 Active supine Left 10/06/22 Passive supine Left 10/19/22 Passive  supine Left 11/03/21 Passive supine Left 11/16/22 AROM/PROM In  standing  Shoulder flexion 160 122 140 154 50/WNL  Shoulder extension  20      Shoulder abduction 162 86  148 50/WNL  Shoulder adduction       Shoulder internal rotation 64 Shoulder abd 45 deg 52 Shoulder abd 45 deg  75 deg(shoulder abd to 45)   Shoulder external rotation 75 60 73  (50 deg abdct) 78  (Shoulder abd 45 deg) 65/WNL  Elbow flexion 126 125     Elbow extension 0 0     Wrist flexion       Wrist extension       Wrist ulnar deviation       Wrist radial deviation       Wrist pronation       Wrist supination       (Blank rows = not tested)  CERVICAL active ROM    Cervical flexion: 25 deg Cervical extension; 18 deg with discomfort noted Cervical rotation left:  54 deg Cervical rotation right:  56 deg Cervical side bending left: 22 deg Cervical side bending right:  32 deg   11/03/21:  Cervical flexion: 30 deg Cervical extension; 26 deg with discomfort noted Cervical rotation left:  56 deg Cervical rotation right:  60 deg    UPPER EXTREMITY MMT:  MMT Right 10/06/22 Left 1/8//23 Left 11/16/22  Shoulder flexion 5/5 2+ 2+  Shoulder extension 5/5 5   Shoulder abduction 5/5 2+ 2+  Shoulder adduction     Shoulder internal rotation 5/5 4+   Shoulder external rotation 5/5 2+ 3  (Blank rows = not tested)     PALPATION:  TTP, anterior shoulder, around incision site  TODAY'S TREATMENT:  DATE: 11/23/22 TherEx Rows L4 band; 3x10 Extension L4 band; 3x10 IR L2 band 3x10; Lt ER walkout L2 band 3x10; Lt NMES/Russian stimulation 5/5 sec on/off with intensity to tolerance X 10 min while he performs active flexion in supine and sitting x 5 min each Wall walk flexion with backing away working on eccentric control x 10 reps   11/19/22 -UE ranger AAROM X 15 circles, flexion, abduction -NMES/Russian stimulation 5/5 sec on/off with intensity to tolerance X 14 min while he performs active abduction (7 min in sidelying) , flexion (6  min in supine, 1 min sitting)  during on phase -Rows blue X 20 -Extensions blue X 20 -IR green X 20 -ER yellow X 15, then isometric step outs X 15 -Wall push ups X 20  11/16/22 -UE ranger AAROM X 15 circles, flexion, abduction -NMES/Russian stimulation 5/5 sec on/off with intensity to tolerance X 15 min while he performs active abduction (5 min in sitting) , flexion (5 min in supine) ER (5 min  during on phase -Rows blue X 20 -Extensions blue X 20 -IR green X 20 -ER yellow X 15, then isometric step outs X 15 -Wall ladder X 5 flexion, X 5 abduction with slow eccentric lower -Seated short lever shoulder abduction  X10  11/12/22 TherEx UBE L5 x 8 min (4' each direction) Rows L4 3x10; 5 sec hold Shoulder Ext L4 band 3x10; 3 sec hold IR/ER walkout with L3 band 2x10; Lt Supine active shoulder flexion partial range with short lever arm 2x10; Lt Supine chest press AA concentric/ Lt active eccentric full range with 3# bar; 2x10 Standing partial range short lever arm Lt flexion x 20 reps (cues to decrease shrug) Seated partial range short lever arm Lt abduction x 20 reps (cues to decrease shrug)    PATIENT EDUCATION: Education details: HEP, POC Person educated: Patient Education method: Consulting civil engineer, Demonstration, Verbal cues, and Handouts Education comprehension: verbalized understanding, returned demonstration, and verbal cues required  HOME EXERCISE PROGRAM: Access Code: [email or web address] URL: [email or web address] Date: 10/29/2022 Prepared by: Elsie Ra  Exercises - Seated Upper Trapezius Stretch  - 2-3 x daily - 7 x weekly - 3 reps - 10 sec hold - Supine Shoulder Flexion with Dowel  - 2 x daily - 7 x weekly - 2 sets - 10 reps - 3-5 sec hold - Supine Shoulder External Rotation in 45 Degrees Abduction AAROM with Dowel  - 2 x daily - 7 x weekly - 2 sets - 10 reps - Isometric Shoulder Flexion with Ball at [employer]  - 2 x daily - 7 x weekly - 2 sets - 10 reps - 5 sec hold -  Isometric Shoulder Abduction with Ball at [employer]  - 2 x daily - 7 x weekly - 2 sets - 10 reps - 5 sec hold - Isometric Shoulder External Rotation with Ball at [employer]  - 2 x daily - 7 x weekly - 2 sets - 10 reps - 5 sec hold - Standing Isometric Shoulder Internal Rotation at Wall with Ball  - 2 x daily - 7 x weekly - 2 sets - 10 reps - 5 sec hold - Standing Row with Anchored Resistance  - 2 x daily - 6 x weekly - 2-3 sets - 10 reps - Shoulder extension with resistance - Neutral  - 2 x daily - 6 x weekly - 2-3 sets - 10 reps - Shoulder Internal Rotation with Resistance (Mirrored)  - 2 x daily - 6 x weekly - 2-3 sets -  10 reps - Shoulder External Rotation with Anchored Resistance  - 2 x daily - 6 x weekly - 2-3 sets - 10 reps  ASSESSMENT:  CLINICAL IMPRESSION: Pt tolerated session well today, focusing on active assistive exercises to get his shoulder muscles working.  Will continue to benefit from PT to maximize function.  OBJECTIVE IMPAIRMENTS: decreased activity tolerance, decreased mobility, decreased ROM, decreased strength, impaired UE functional use, and pain.   ACTIVITY LIMITATIONS: carrying, lifting, sleeping, and reach over head  PARTICIPATION LIMITATIONS: occupation and yard work  PERSONAL FACTORS: see pertinent medical  are also affecting patient's functional outcome.   REHAB POTENTIAL: Good  CLINICAL DECISION MAKING: Stable/uncomplicated  EVALUATION COMPLEXITY: Low   GOALS: Goals reviewed with patient? Yes  SHORT TERM GOALS: (target date for Short term goals are 3 weeks 10/30/22)  1.Patient will demonstrate independent use of home exercise program to maintain progress from in clinic treatments. Goal status: New  LONG TERM GOALS: (target dates for all long term goals are 10 weeks  12/18/2022)   1. Patient will demonstrate/report pain at worst less than or equal to 2/10 to facilitate minimal limitation in daily activity secondary to pain symptoms. Goal status: New   2.  Patient will demonstrate independent use of home exercise program to facilitate ability to maintain/progress functional gains from skilled physical therapy services. Goal status: New   3. Patient will demonstrate FOTO outcome > or = 66 % to indicate reduced disability due to condition. Goal status: New   4.  Patient will demonstrate left UE MMT 5/5 throughout to facilitate lifting, reaching, carrying at [facility] in daily activity.   Goal status: New   5.  Patient will demonstrate left shoulder flexion >/= 150 degrees without symptoms to facilitate usual overhead reaching, self care, dressing at PLOF.    Goal status: New   6.  Pt will improve left shoulder ER to >/= 65 degrees in order to facility ADL's.   Goal status: New   7. Pt will improve bilateral cervical rotation to >/= 65 degrees for improvements in functional mobility and activities of daily living.   Goal status: New     PLAN:  PT FREQUENCY: 1-2x/week  PT DURATION: 10 weeks  PLANNED INTERVENTIONS: Therapeutic exercises, Therapeutic activity, Neuro Muscular re-education, Balance training, Gait training, Patient/Family education, Joint mobilization, Stair training, DME instructions, Dry Needling, Electrical stimulation, Traction, Cryotherapy, vasopneumatic device Moist heat, Taping, Ultrasound, Ionotophoresis '4mg'$ /ml Dexamethasone, and Manual therapy.  All included unless contraindicated  PLAN FOR NEXT SESSION:  NMES for RTC and deltoid activation, scapular strength progressions as tolerated.    Laureen Abrahams, PT, DPT 11/23/22 8:42 AM

## 2022-11-26 ENCOUNTER — Ambulatory Visit (INDEPENDENT_AMBULATORY_CARE_PROVIDER_SITE_OTHER): Payer: 59 | Admitting: Physical Therapy

## 2022-11-26 ENCOUNTER — Encounter: Payer: Self-pay | Admitting: Physical Therapy

## 2022-11-26 DIAGNOSIS — R600 Localized edema: Secondary | ICD-10-CM | POA: Diagnosis not present

## 2022-11-26 DIAGNOSIS — R29810 Facial weakness: Secondary | ICD-10-CM

## 2022-11-26 DIAGNOSIS — M542 Cervicalgia: Secondary | ICD-10-CM | POA: Diagnosis not present

## 2022-11-26 DIAGNOSIS — M25512 Pain in left shoulder: Secondary | ICD-10-CM | POA: Diagnosis not present

## 2022-11-26 DIAGNOSIS — M25612 Stiffness of left shoulder, not elsewhere classified: Secondary | ICD-10-CM | POA: Diagnosis not present

## 2022-11-26 NOTE — Therapy (Signed)
OUTPATIENT PHYSICAL THERAPY TREATMENT NOTE   Patient Name: Gregory Dorsey MRN: [id number] DOB:Mar 28, 1966, 57 y.o., male Today's Date: 11/26/2022  END OF SESSION:   PT End of Session - 11/26/22 0805     Visit Number 12    Number of Visits 16    Date for PT Re-Evaluation 12/04/22    PT Start Time 0802    PT Stop Time 0841    PT Time Calculation (min) 39 min    Activity Tolerance Patient tolerated treatment well    Behavior During Therapy [facility] for tasks assessed/performed                    Past Medical History:  Diagnosis Date   Kidney stone    x2   Reflux    Past Surgical History:  Procedure Laterality Date   WRIST SURGERY Left 29 years ago   Patient Active Problem List   Diagnosis Date Noted   Nontraumatic complete tear of left rotator cuff 07/10/2022   Impingement syndrome of left shoulder 07/10/2022   Tendinopathy of left biceps tendon 07/10/2022   Traumatic tear of supraspinatus tendon of right shoulder 06/22/2019   Family history of early CAD 07/22/2018   High cholesterol 05/29/2016   Erectile dysfunction 05/14/2015   Prediabetes 01/26/2013   GERD (gastroesophageal reflux disease) 01/12/2013   NEPHROLITHIASIS, HX OF 02/13/2008     THERAPY DIAG:  Acute pain of left shoulder  Stiffness of left shoulder, not elsewhere classified  Localized edema  Cervicalgia  Facial weakness   PCP:  Jinny Sanders, MD   REFERRING PROVIDER: Frankey Shown MD  REFERRING DIAG: M75.42 (ICD-10-CM) - Impingement syndrome of left shoulder M75.122 (ICD-10-CM) - Nontraumatic complete tear of left rotator cuff  EVAL THERAPY DIAG:  Acute pain of left shoulder  Stiffness of left shoulder, not elsewhere classified  Facial weakness  Localized edema  Cervicalgia  Rationale for Evaluation and Treatment: Rehabilitation  ONSET DATE: 09/17/22   SUBJECTIVE:  Subjective: Sore today but otherwise no other reports   PERTINENT HISTORY:  LEFT SHOULDER SCOPE, BICEPS TENODESIS, SAD, EXTENSIVE DEBRIDEMENT 09/17/22    Kidney stones, reflux, wrist surgery  PAIN:  NPRS scale: no pain at rest and with flexion movments below 90 degrees, no pain on UBE   PRECAUTIONS: Shoulder  WEIGHT BEARING RESTRICTIONS: No  FALLS:  Has patient fallen in last 6 months? No  LIVING ENVIRONMENT: Lives with: lives with their family and lives with their spouse Lives in: House/apartment Stairs: Yes: External: 3 steps; none Has following equipment at home: None  OCCUPATION:   Owns a farm  PLOF: Independent  PATIENT GOALS: Stop hurting     OBJECTIVE:   DIAGNOSTIC FINDINGS: 07/10/22  MRI of the right shoulder and cervical spine are relatively unremarkable.  The prior rotator cuff repair is intact.  He has some bulging disks with some mild to moderate foraminal narrowing but his symptoms are more consistent with MRI findings of the left shoulder which shows a full-thickness supraspinatus tear with slight retraction.  He has medial dislocation of the biceps tendon.  Unfavorable acromion and significant AC joint arthritis.  Based on these findings I have recommended arthroscopic rotator cuff repair and biceps tenodesis, debridements as indicated.   PATIENT SURVEYS:  10/06/22: FOTO intake:  37%  11/23/22: FOTO 45  POSTURE: Forward head and rounded shoulder  UPPER EXTREMITY ROM:   ROM Right 10/06/22 Active supine Left 10/06/22 Passive supine Left 10/19/22 Passive  supine Left 11/03/21 Passive supine Left 11/16/22 AROM/PROM In  standing  Shoulder flexion 160 122 140 154 50/WNL  Shoulder extension  20      Shoulder abduction 162 86  148 50/WNL  Shoulder adduction       Shoulder internal rotation 64 Shoulder abd 45 deg 52 Shoulder abd 45 deg  75 deg(shoulder abd to 45)   Shoulder external rotation 75 60 73  (50 deg abdct) 78  (Shoulder abd 45 deg) 65/WNL  Elbow flexion 126 125     Elbow extension 0 0     Wrist flexion       Wrist extension       Wrist ulnar deviation       Wrist radial deviation       Wrist pronation       Wrist supination       (Blank rows = not tested)  CERVICAL active ROM    Cervical flexion: 25 deg Cervical extension; 18 deg with discomfort noted Cervical rotation left:  54 deg Cervical rotation right:  56 deg Cervical side bending left: 22 deg Cervical side bending right:  32 deg   11/03/21:  Cervical flexion: 30 deg Cervical extension; 26 deg with discomfort noted Cervical rotation left:  56 deg Cervical rotation right:  60 deg    UPPER EXTREMITY MMT:  MMT Right 10/06/22 Left 1/8//23 Left 11/16/22  Shoulder flexion 5/5 2+ 2+  Shoulder extension 5/5 5   Shoulder abduction 5/5 2+ 2+  Shoulder adduction     Shoulder internal rotation 5/5 4+   Shoulder external rotation 5/5 2+ 3  (Blank rows = not tested)     PALPATION:  TTP, anterior shoulder, around incision site  TODAY'S TREATMENT:  DATE: 11/26/22 TherEx NMES/Russian stimulation 5/5 sec on/off with intensity to tolerance (59m) X 10 min while he performs active flexion in supine and sitting x 5 min each; then AA abduction in sitting with elbow flexion x 10 min Supine 90 deg flexion with 3#, circles CW/CCW x 30 reps each direction Rhythmic Stabilization  Rows L4 band 3x10   11/23/22 TherEx Rows L4 band; 3x10 Extension L4 band; 3x10 IR L2 band 3x10; Lt ER walkout L2 band 3x10; Lt NMES/Russian stimulation 5/5 sec on/off with intensity to tolerance X 10 min while he  performs active flexion in supine and sitting x 5 min each Wall walk flexion with backing away working on eccentric control x 10 reps   11/19/22 -UE ranger AAROM X 15 circles, flexion, abduction -NMES/Russian stimulation 5/5 sec on/off with intensity to tolerance X 14 min while he performs active abduction (7 min in sidelying) , flexion (6 min in supine, 1 min sitting)  during on phase -Rows blue X 20 -Extensions blue X 20 -IR green X 20 -ER yellow X 15, then isometric step outs X 15 -Wall push ups X 20  11/16/22 -UE ranger AAROM X 15 circles, flexion, abduction -NMES/Russian stimulation 5/5 sec on/off with intensity to tolerance X 15 min while he performs active abduction (5 min in sitting) , flexion (5 min in supine) ER (5 min  during on phase -Rows blue X 20 -Extensions blue X 20 -IR green X 20 -ER yellow X 15, then isometric step outs X 15 -Wall ladder X 5 flexion, X 5 abduction with slow eccentric lower -Seated short lever shoulder abduction  X10    PATIENT EDUCATION: Education details: HEP, POC Person educated: Patient Education method: EConsulting civil engineer Demonstration, Verbal cues, and Handouts Education comprehension: verbalized understanding, returned demonstration, and verbal cues required  HOME EXERCISE PROGRAM: Access Code: [email or web address]: [email or web address] Date: 10/29/2022 Prepared by: BElsie Ra Exercises - Seated Upper Trapezius Stretch  - 2-3 x daily - 7 x weekly - 3 reps - 10 sec hold - Supine Shoulder Flexion with Dowel  - 2 x daily - 7 x weekly - 2 sets - 10 reps - 3-5 sec hold - Supine Shoulder External Rotation in 45 Degrees Abduction AAROM with Dowel  - 2 x daily - 7 x weekly - 2 sets - 10 reps - Isometric Shoulder Flexion with Ball at [employer] - 2 x daily - 7 x weekly - 2 sets - 10 reps - 5 sec hold - Isometric Shoulder Abduction with Ball at [employer] - 2 x daily - 7 x weekly - 2 sets - 10 reps - 5 sec hold - Isometric Shoulder External Rotation  with Ball at [employer] - 2 x daily - 7 x weekly - 2 sets - 10 reps - 5 sec hold - Standing Isometric Shoulder Internal Rotation at Wall with Ball  - 2 x daily - 7 x weekly - 2 sets - 10 reps - 5 sec hold - Standing Row with Anchored Resistance  - 2 x daily - 6 x weekly - 2-3 sets - 10 reps - Shoulder extension with resistance - Neutral  - 2 x daily - 6 x weekly - 2-3 sets - 10 reps - Shoulder Internal Rotation with Resistance (Mirrored)  - 2 x daily - 6 x weekly - 2-3 sets - 10 reps - Shoulder External Rotation with Anchored Resistance  - 2 x daily - 6 x weekly - 2-3 sets - 10 reps  ASSESSMENT:  CLINICAL IMPRESSION: Continued work with NMES to work on muscle activation. Continues to have difficulty with active movement.  Will continue to benefit from PT to maximize function.  OBJECTIVE IMPAIRMENTS: decreased activity tolerance, decreased mobility, decreased ROM, decreased strength, impaired UE functional use, and pain.   ACTIVITY LIMITATIONS: carrying, lifting, sleeping, and reach over head  PARTICIPATION LIMITATIONS: occupation and yard work  PERSONAL FACTORS: see pertinent medical  are also affecting patient's functional outcome.   REHAB POTENTIAL: Good  CLINICAL DECISION MAKING: Stable/uncomplicated  EVALUATION COMPLEXITY: Low   GOALS: Goals reviewed with patient? Yes  SHORT TERM GOALS: (target date for Short term goals are 3 weeks 10/30/22)  1.Patient will demonstrate independent use of home exercise program to maintain progress from in clinic treatments. Goal status: New  LONG TERM GOALS: (target dates for all long term goals are 10 weeks  12/18/2022)   1. Patient will demonstrate/report pain at worst less than or equal to 2/10 to facilitate minimal limitation in daily activity secondary to pain symptoms. Goal status: New   2. Patient will demonstrate independent use of home exercise program to facilitate ability to maintain/progress functional gains from skilled physical  therapy services. Goal status: New   3. Patient will demonstrate FOTO outcome > or = 66 % to indicate reduced disability due to condition. Goal status: New   4.  Patient will demonstrate left UE MMT 5/5 throughout to facilitate lifting, reaching, carrying at [facility] in daily activity.   Goal status: New   5.  Patient will demonstrate left shoulder flexion >/= 150 degrees without symptoms to facilitate usual overhead reaching, self care, dressing at PLOF.    Goal status: New   6.  Pt will improve left shoulder ER to >/= 65 degrees in order to facility ADL's.   Goal status: New   7. Pt will improve bilateral cervical rotation to >/= 65 degrees for improvements in functional mobility and activities of daily living.   Goal status: New     PLAN:  PT FREQUENCY: 1-2x/week  PT DURATION: 10 weeks  PLANNED INTERVENTIONS: Therapeutic exercises, Therapeutic activity, Neuro Muscular re-education, Balance training, Gait training, Patient/Family education, Joint mobilization, Stair training, DME instructions, Dry Needling, Electrical stimulation, Traction, Cryotherapy, vasopneumatic device Moist heat, Taping, Ultrasound, Ionotophoresis '4mg'$ /ml Dexamethasone, and Manual therapy.  All included unless contraindicated  PLAN FOR NEXT SESSION:  NMES for RTC and deltoid activation, scapular strength progressions as tolerated.    Laureen Abrahams, PT, DPT 11/26/22 8:43 AM

## 2022-11-30 ENCOUNTER — Encounter: Payer: Self-pay | Admitting: Physical Therapy

## 2022-11-30 ENCOUNTER — Ambulatory Visit (INDEPENDENT_AMBULATORY_CARE_PROVIDER_SITE_OTHER): Payer: 59 | Admitting: Physical Therapy

## 2022-11-30 DIAGNOSIS — M25512 Pain in left shoulder: Secondary | ICD-10-CM

## 2022-11-30 DIAGNOSIS — R600 Localized edema: Secondary | ICD-10-CM | POA: Diagnosis not present

## 2022-11-30 DIAGNOSIS — M25612 Stiffness of left shoulder, not elsewhere classified: Secondary | ICD-10-CM

## 2022-11-30 NOTE — Therapy (Signed)
OUTPATIENT PHYSICAL THERAPY TREATMENT NOTE   Patient Name: Gregory Dorsey MRN: [id number] DOB:November 03, 1965, 57 y.o., male Today's Date: 11/30/2022  END OF SESSION:   PT End of Session - 11/30/22 0805     Visit Number 13    Number of Visits 16    Date for PT Re-Evaluation 12/04/22    PT Start Time 0803    PT Stop Time 0843    PT Time Calculation (min) 40 min    Activity Tolerance Patient tolerated treatment well    Behavior During Therapy [facility] for tasks assessed/performed                    Past Medical History:  Diagnosis Date   Kidney stone    x2   Reflux    Past Surgical History:  Procedure Laterality Date   WRIST SURGERY Left 29 years ago   Patient Active Problem List   Diagnosis Date Noted   Nontraumatic complete tear of left rotator cuff 07/10/2022   Impingement syndrome of left shoulder 07/10/2022   Tendinopathy of left biceps tendon 07/10/2022   Traumatic tear of supraspinatus tendon of right shoulder 06/22/2019   Family history of early CAD 07/22/2018   High cholesterol 05/29/2016   Erectile dysfunction 05/14/2015   Prediabetes 01/26/2013   GERD (gastroesophageal reflux disease) 01/12/2013   NEPHROLITHIASIS, HX OF 02/13/2008     THERAPY DIAG:  Acute pain of left shoulder  Stiffness of left shoulder, not elsewhere classified  Localized edema   PCP:  Jinny Sanders, MD   REFERRING PROVIDER: Frankey Shown MD  REFERRING DIAG: M75.42 (ICD-10-CM) - Impingement syndrome of left shoulder M75.122 (ICD-10-CM) - Nontraumatic complete tear of left rotator cuff  EVAL THERAPY DIAG:  Acute pain of left shoulder  Stiffness of left shoulder, not elsewhere classified  Facial weakness  Localized edema  Cervicalgia  Rationale for Evaluation and Treatment: Rehabilitation  ONSET DATE: 09/17/22   SUBJECTIVE:  Subjective: He denies pain upon arrival, feels more like a bruise that is sore than pain in his shoulder.    PERTINENT HISTORY:  LEFT SHOULDER SCOPE, BICEPS TENODESIS, SAD, EXTENSIVE DEBRIDEMENT 09/17/22    Kidney stones, reflux, wrist surgery  PAIN:  NPRS scale: no pain at rest and with flexion movments below 90 degrees, no pain on UBE   PRECAUTIONS: Shoulder  WEIGHT BEARING RESTRICTIONS: No  FALLS:  Has patient fallen in last 6 months? No  LIVING ENVIRONMENT: Lives with: lives with their family and lives with their spouse Lives in: House/apartment Stairs: Yes: External: 3 steps; none Has following equipment at home: None  OCCUPATION:   Owns a farm  PLOF: Independent  PATIENT GOALS: Stop hurting     OBJECTIVE:   DIAGNOSTIC FINDINGS: 07/10/22  MRI of the right shoulder and cervical spine are relatively unremarkable.  The prior rotator cuff repair is intact.  He has some bulging disks with some mild to moderate foraminal narrowing but his symptoms are more consistent with MRI findings of the left shoulder which shows a full-thickness supraspinatus tear with slight retraction.  He has medial dislocation of the biceps tendon.  Unfavorable acromion and significant AC joint arthritis.  Based on these findings I have recommended arthroscopic rotator cuff repair and biceps tenodesis, debridements as indicated.   PATIENT SURVEYS:  10/06/22: FOTO intake:  37%  11/23/22: FOTO 45  POSTURE: Forward head and rounded shoulder  UPPER EXTREMITY ROM:   ROM Right 10/06/22 Active supine Left 10/06/22 Passive supine Left 10/19/22 Passive  supine Left 11/03/21 Passive supine Left 11/16/22 AROM/PROM In  standing  Shoulder flexion 160 122 140 154 50/WNL  Shoulder  extension  20     Shoulder abduction 162 86  148 50/WNL  Shoulder adduction       Shoulder internal rotation 64 Shoulder abd 45 deg 52 Shoulder abd 45 deg  75 deg(shoulder abd to 45)   Shoulder external rotation 75 60 73  (50 deg abdct) 78  (Shoulder abd 45 deg) 65/WNL  Elbow flexion 126 125     Elbow extension 0 0     Wrist flexion       Wrist extension       Wrist ulnar deviation       Wrist radial deviation       Wrist pronation       Wrist supination       (Blank rows = not tested)  CERVICAL active ROM    Cervical flexion: 25 deg Cervical extension; 18 deg with discomfort noted Cervical rotation left:  54 deg Cervical rotation right:  56 deg Cervical side bending left: 22 deg Cervical side bending right:  32 deg   11/03/21:  Cervical flexion: 30 deg Cervical extension; 26 deg with discomfort noted Cervical rotation left:  56 deg Cervical rotation right:  60 deg    UPPER EXTREMITY MMT:  MMT Right 10/06/22 Left 1/8//23 Left 11/16/22  Shoulder flexion 5/5 2+ 2+  Shoulder extension 5/5 5   Shoulder abduction 5/5 2+ 2+  Shoulder adduction     Shoulder internal rotation 5/5 4+   Shoulder external rotation 5/5 2+ 3  (Blank rows = not tested)     PALPATION:  TTP, anterior shoulder, around incision site  TODAY'S TREATMENT:  DATE: 11/30/22 -UE ranger AAROM X 15 circles, flexion, abduction -Pball up wall shoulder flexion AAROM  X10 -NMES/Russian stimulation 5/5 sec on/off with intensity to tolerance X 18 min while he performs active abduction (5 min in sitting) , flexion (5 min in supine, 3 min sitting) ER (5 min in sidelying during on phase) -Rows blue X 20 -Extensions blue X 20 -IR green X 20 -ER yellow 3X10  11/26/22 TherEx NMES/Russian stimulation 5/5 sec on/off with intensity to tolerance (44m) X 10 min while he performs active flexion  in supine and sitting x 5 min each; then AA abduction in sitting with elbow flexion x 10 min Supine 90 deg flexion with 3#, circles CW/CCW x 30 reps each direction Rhythmic Stabilization  Rows L4 band 3x10   PATIENT EDUCATION: Education details: HEP, POC Person educated: Patient Education method: EConsulting civil engineer DMedia planner Verbal cues, and Handouts Education comprehension: verbalized understanding, returned demonstration, and verbal cues required  HOME EXERCISE PROGRAM: Access Code: [email or web address]: [email or web address] Date: 10/29/2022 Prepared by: BElsie Ra Exercises - Seated Upper Trapezius Stretch  - 2-3 x daily - 7 x weekly - 3 reps - 10 sec hold - Supine Shoulder Flexion with Dowel  - 2 x daily - 7 x weekly - 2 sets - 10 reps - 3-5 sec hold - Supine Shoulder External Rotation in 45 Degrees Abduction AAROM with Dowel  - 2 x daily - 7 x weekly - 2 sets - 10 reps - Isometric Shoulder Flexion with Ball at [employer] - 2 x daily - 7 x weekly - 2 sets - 10 reps - 5 sec hold - Isometric Shoulder Abduction with Ball at [employer] - 2 x daily - 7 x weekly - 2 sets - 10 reps - 5 sec hold - Isometric Shoulder External Rotation with Ball at [employer] - 2 x daily - 7 x weekly - 2 sets - 10 reps - 5 sec hold - Standing Isometric Shoulder Internal Rotation at Wall with Ball  - 2 x daily - 7 x weekly - 2 sets - 10 reps - 5 sec hold - Standing Row with Anchored Resistance  - 2 x daily - 6 x weekly - 2-3 sets - 10 reps - Shoulder extension with resistance - Neutral  - 2 x daily - 6 x weekly - 2-3 sets - 10 reps - Shoulder Internal Rotation with Resistance (Mirrored)  - 2 x daily - 6 x weekly - 2-3 sets - 10 reps - Shoulder External Rotation with Anchored Resistance  - 2 x daily - 6 x weekly - 2-3 sets - 10 reps  ASSESSMENT:  CLINICAL IMPRESSION: Continued work with NMES to work on muscle activation. He has some improvements noted with ER strength and with gravity eliminated flexion but still  lacks the strength to lift his arm against gravity for flexion and abduction planes. PT recommending to continue to focus on strength to improve functional use of his Lt UE.  OBJECTIVE IMPAIRMENTS: decreased activity tolerance, decreased mobility, decreased ROM, decreased strength, impaired UE functional use, and pain.   ACTIVITY LIMITATIONS: carrying, lifting, sleeping, and reach over head  PARTICIPATION LIMITATIONS: occupation and yard work  PERSONAL FACTORS: see pertinent medical  are also affecting patient's functional outcome.   REHAB POTENTIAL: Good  CLINICAL DECISION MAKING: Stable/uncomplicated  EVALUATION COMPLEXITY: Low   GOALS: Goals reviewed with patient? Yes  SHORT TERM GOALS: (target date for Short term goals are 3 weeks 10/30/22)  1.Patient will demonstrate independent use  of home exercise program to maintain progress from in clinic treatments. Goal status: MET 10/30/22  LONG TERM GOALS: (target dates for all long term goals are 10 weeks  12/18/2022)   1. Patient will demonstrate/report pain at worst less than or equal to 2/10 to facilitate minimal limitation in daily activity secondary to pain symptoms. Goal status: ongoing, can still get pain with active movements 11/29/22   2. Patient will demonstrate independent use of home exercise program to facilitate ability to maintain/progress functional gains from skilled physical therapy services. Goal status: ongoing, his HEP is being updated PRN   3. Patient will demonstrate FOTO outcome > or = 66 % to indicate reduced disability due to condition. Goal status: ongoing   4.  Patient will demonstrate left UE MMT 5/5 throughout to facilitate lifting, reaching, carrying at [facility] in daily activity.   Goal status: ongoing 11/30/22 currently 3- flexion and abd   5.  Patient will demonstrate left shoulder flexion >/= 150 degrees without symptoms to facilitate usual overhead reaching, self care, dressing at PLOF.    Goal  status: ongoing 11/30/22   6.  Pt will improve left shoulder ER to >/= 65 degrees in order to facility ADL's.   Goal status: ongoing 11/30/22   7. Pt will improve bilateral cervical rotation to >/= 65 degrees for improvements in functional mobility and activities of daily living.   Goal status: ongoing     PLAN:  PT FREQUENCY: 1-2x/week  PT DURATION: 10 weeks  PLANNED INTERVENTIONS: Therapeutic exercises, Therapeutic activity, Neuro Muscular re-education, Balance training, Gait training, Patient/Family education, Joint mobilization, Stair training, DME instructions, Dry Needling, Electrical stimulation, Traction, Cryotherapy, vasopneumatic device Moist heat, Taping, Ultrasound, Ionotophoresis '4mg'$ /ml Dexamethasone, and Manual therapy.  All included unless contraindicated  PLAN FOR NEXT SESSION:  NMES for RTC and deltoid activation, scapular strength progressions as tolerated.    Elsie Ra, PT, DPT 11/30/22 8:06 AM

## 2022-12-03 ENCOUNTER — Ambulatory Visit (INDEPENDENT_AMBULATORY_CARE_PROVIDER_SITE_OTHER): Payer: 59 | Admitting: Physical Therapy

## 2022-12-03 ENCOUNTER — Encounter: Payer: Self-pay | Admitting: Physical Therapy

## 2022-12-03 DIAGNOSIS — M25612 Stiffness of left shoulder, not elsewhere classified: Secondary | ICD-10-CM

## 2022-12-03 DIAGNOSIS — M25512 Pain in left shoulder: Secondary | ICD-10-CM

## 2022-12-03 DIAGNOSIS — M542 Cervicalgia: Secondary | ICD-10-CM | POA: Diagnosis not present

## 2022-12-03 DIAGNOSIS — R600 Localized edema: Secondary | ICD-10-CM

## 2022-12-03 NOTE — Therapy (Signed)
OUTPATIENT PHYSICAL THERAPY TREATMENT NOTE   Patient Name: Gregory Dorsey MRN: [id number] DOB:May 11, 1966, 57 y.o., male Today's Date: 12/03/2022  END OF SESSION:   PT End of Session - 12/03/22 0813     Visit Number 14    Number of Visits 16    Date for PT Re-Evaluation 12/04/22    PT Start Time 0801    PT Stop Time 0843    PT Time Calculation (min) 42 min    Activity Tolerance Patient tolerated treatment well    Behavior During Therapy [facility] for tasks assessed/performed                     Past Medical History:  Diagnosis Date   Kidney stone    x2   Reflux    Past Surgical History:  Procedure Laterality Date   WRIST SURGERY Left 29 years ago   Patient Active Problem List   Diagnosis Date Noted   Nontraumatic complete tear of left rotator cuff 07/10/2022   Impingement syndrome of left shoulder 07/10/2022   Tendinopathy of left biceps tendon 07/10/2022   Traumatic tear of supraspinatus tendon of right shoulder 06/22/2019   Family history of early CAD 07/22/2018   High cholesterol 05/29/2016   Erectile dysfunction 05/14/2015   Prediabetes 01/26/2013   GERD (gastroesophageal reflux disease) 01/12/2013   NEPHROLITHIASIS, HX OF 02/13/2008     THERAPY DIAG:  Acute pain of left shoulder  Stiffness of left shoulder, not elsewhere classified  Localized edema  Cervicalgia   PCP:  Jinny Sanders, MD   REFERRING PROVIDER: Frankey Shown MD  REFERRING DIAG: M75.42 (ICD-10-CM) - Impingement syndrome of left shoulder M75.122 (ICD-10-CM) - Nontraumatic complete tear of left rotator cuff  EVAL THERAPY DIAG:  Acute pain of left shoulder  Stiffness of left shoulder, not elsewhere classified  Facial weakness  Localized edema  Cervicalgia  Rationale for Evaluation and Treatment: Rehabilitation  ONSET DATE: 09/17/22   SUBJECTIVE:  Subjective: He denies pain upon arrival, feels more like a bruise that is sore than pain in his shoulder.    PERTINENT HISTORY:  LEFT SHOULDER SCOPE, BICEPS TENODESIS, SAD, EXTENSIVE DEBRIDEMENT 09/17/22    Kidney stones, reflux, wrist surgery  PAIN:  NPRS scale: no pain at rest and with flexion movments below 90 degrees, no pain on UBE   PRECAUTIONS: Shoulder  WEIGHT BEARING RESTRICTIONS: No  FALLS:  Has patient fallen in last 6 months? No  LIVING ENVIRONMENT: Lives with: lives with their family and lives with their spouse Lives in: House/apartment Stairs: Yes: External: 3 steps; none Has following equipment at home: None  OCCUPATION:   Owns a farm  PLOF: Independent  PATIENT GOALS: Stop hurting     OBJECTIVE:   DIAGNOSTIC FINDINGS: 07/10/22  MRI of the right shoulder and cervical spine are relatively unremarkable.  The prior rotator cuff repair is intact.  He has some bulging disks with some mild to moderate foraminal narrowing but his symptoms are more consistent with MRI findings of the left shoulder which shows a full-thickness supraspinatus tear with slight retraction.  He has medial dislocation of the biceps tendon.  Unfavorable acromion and significant AC joint arthritis.  Based on these findings I have recommended arthroscopic rotator cuff repair and biceps tenodesis, debridements as indicated.   PATIENT SURVEYS:  10/06/22: FOTO intake:  37%  11/23/22: FOTO 45  POSTURE: Forward head and rounded shoulder  UPPER EXTREMITY ROM:   ROM Right 10/06/22 Active supine Left 10/06/22 Passive supine Left 10/19/22 Passive  supine Left 11/03/21 Passive supine Left 11/16/22 AROM/PROM In  standing  Shoulder flexion 160 122 140 154 50/WNL   Shoulder extension  20     Shoulder abduction 162 86  148 50/WNL  Shoulder adduction       Shoulder internal rotation 64 Shoulder abd 45 deg 52 Shoulder abd 45 deg  75 deg(shoulder abd to 45)   Shoulder external rotation 75 60 73  (50 deg abdct) 78  (Shoulder abd 45 deg) 65/WNL  Elbow flexion 126 125     Elbow extension 0 0     Wrist flexion       Wrist extension       Wrist ulnar deviation       Wrist radial deviation       Wrist pronation       Wrist supination       (Blank rows = not tested)  CERVICAL active ROM    Cervical flexion: 25 deg Cervical extension; 18 deg with discomfort noted Cervical rotation left:  54 deg Cervical rotation right:  56 deg Cervical side bending left: 22 deg Cervical side bending right:  32 deg   11/03/21:  Cervical flexion: 30 deg Cervical extension; 26 deg with discomfort noted Cervical rotation left:  56 deg Cervical rotation right:  60 deg    UPPER EXTREMITY MMT:  MMT Right 10/06/22 Left 1/8//23 Left 11/16/22  Shoulder flexion 5/5 2+ 2+  Shoulder extension 5/5 5   Shoulder abduction 5/5 2+ 2+  Shoulder adduction     Shoulder internal rotation 5/5 4+   Shoulder external rotation 5/5 2+ 3  (Blank rows = not tested)     PALPATION:  TTP, anterior shoulder, around incision site  TODAY'S TREATMENT:  DATE: 12/03/22 -UE ranger AAROM X 15 circles, flexion, abduction -Pball up wall shoulder flexion AAROM  X10 -NMES/Russian stimulation 5/5 sec on/off with intensity to tolerance X 18 min while he performs active abduction (5 min in sitting) , flexion (5 min in supine, 3 min sitting) ER (5 min in sidelying during on phase) -Rows blue X 20 -Extensions blue X 20 -IR green X 20 -ER yellow 3X10  11/30/22 -UE ranger AAROM X 15 circles, flexion, abduction -Pball up wall shoulder flexion AAROM  X10 -bicep curls 10#  2X10 bilat -Rows blue X 20 -Extensions blue X 20 -IR green X 20 -ER red 3X10 -Standing shoulder flexion AAROM 1# bar X 15 -wall push ups 2X10 -NMES/Russian stimulation 5/5 sec on/off with intensity to tolerance X 15 min while he performs active abduction (5 min in sidelying) , flexion (5 min in supine) ER (5 min in sidelying during on phase)  11/26/22 TherEx NMES/Russian stimulation 5/5 sec on/off with intensity to tolerance (19m) X 10 min while he performs active flexion in supine and sitting x 5 min each; then AA abduction in sitting with elbow flexion x 10 min Supine 90 deg flexion with 3#, circles CW/CCW x 30 reps each direction Rhythmic Stabilization  Rows L4 band 3x10   PATIENT EDUCATION: Education details: HEP, POC Person educated: Patient Education method: EConsulting civil engineer DMedia planner Verbal cues, and Handouts Education comprehension: verbalized understanding, returned demonstration, and verbal cues required  HOME EXERCISE PROGRAM: Access Code: [email or web address]: [email or web address] Date: 10/29/2022 Prepared by: BElsie Ra Exercises - Seated Upper Trapezius Stretch  - 2-3 x daily - 7 x weekly - 3 reps - 10 sec hold - Supine Shoulder Flexion with Dowel  - 2 x daily - 7 x weekly - 2 sets - 10 reps - 3-5 sec hold - Supine Shoulder External Rotation in 45 Degrees Abduction AAROM with Dowel  - 2 x daily - 7 x weekly - 2 sets - 10 reps - Isometric Shoulder Flexion with Ball at [employer] - 2 x daily - 7 x weekly - 2 sets - 10 reps - 5 sec hold - Isometric Shoulder Abduction with Ball at [employer] - 2 x daily - 7 x weekly - 2 sets - 10 reps - 5 sec hold - Isometric Shoulder External Rotation with Ball at [employer] - 2 x daily - 7 x weekly - 2 sets - 10 reps - 5 sec hold - Standing Isometric Shoulder Internal Rotation at Wall with Ball  - 2 x daily - 7 x weekly - 2 sets - 10 reps - 5 sec hold - Standing Row with Anchored Resistance  - 2 x daily - 6 x weekly - 2-3 sets - 10  reps - Shoulder extension with resistance - Neutral  - 2 x daily - 6 x weekly - 2-3 sets - 10 reps - Shoulder Internal Rotation with Resistance (Mirrored)  - 2 x daily - 6 x weekly - 2-3 sets - 10 reps - Shoulder External Rotation with Anchored Resistance  - 2 x daily - 6 x weekly - 2-3 sets - 10 reps  ASSESSMENT:  CLINICAL IMPRESSION: He showed some improvements with flexion and abduction movements and can now lift his arm against gravity but he also has to use momentum to do this due to weakness. He will need recert next visit as PT recommending to continue to focus on strength to improve functional use of his Lt UE.  OBJECTIVE IMPAIRMENTS: decreased activity tolerance, decreased  mobility, decreased ROM, decreased strength, impaired UE functional use, and pain.   ACTIVITY LIMITATIONS: carrying, lifting, sleeping, and reach over head  PARTICIPATION LIMITATIONS: occupation and yard work  PERSONAL FACTORS: see pertinent medical  are also affecting patient's functional outcome.   REHAB POTENTIAL: Good  CLINICAL DECISION MAKING: Stable/uncomplicated  EVALUATION COMPLEXITY: Low   GOALS: Goals reviewed with patient? Yes  SHORT TERM GOALS: (target date for Short term goals are 3 weeks 10/30/22)  1.Patient will demonstrate independent use of home exercise program to maintain progress from in clinic treatments. Goal status: MET 10/30/22  LONG TERM GOALS: (target dates for all long term goals are 10 weeks  12/18/2022)   1. Patient will demonstrate/report pain at worst less than or equal to 2/10 to facilitate minimal limitation in daily activity secondary to pain symptoms. Goal status: ongoing, can still get pain with active movements 11/29/22   2. Patient will demonstrate independent use of home exercise program to facilitate ability to maintain/progress functional gains from skilled physical therapy services. Goal status: ongoing, his HEP is being updated PRN   3. Patient will  demonstrate FOTO outcome > or = 66 % to indicate reduced disability due to condition. Goal status: ongoing   4.  Patient will demonstrate left UE MMT 5/5 throughout to facilitate lifting, reaching, carrying at [facility] in daily activity.   Goal status: ongoing 11/30/22 currently 3- flexion and abd   5.  Patient will demonstrate left shoulder flexion >/= 150 degrees without symptoms to facilitate usual overhead reaching, self care, dressing at PLOF.    Goal status: ongoing 11/30/22   6.  Pt will improve left shoulder ER to >/= 65 degrees in order to facility ADL's.   Goal status: ongoing 11/30/22   7. Pt will improve bilateral cervical rotation to >/= 65 degrees for improvements in functional mobility and activities of daily living.   Goal status: ongoing     PLAN:  PT FREQUENCY: 1-2x/week  PT DURATION: 10 weeks  PLANNED INTERVENTIONS: Therapeutic exercises, Therapeutic activity, Neuro Muscular re-education, Balance training, Gait training, Patient/Family education, Joint mobilization, Stair training, DME instructions, Dry Needling, Electrical stimulation, Traction, Cryotherapy, vasopneumatic device Moist heat, Taping, Ultrasound, Ionotophoresis '4mg'$ /ml Dexamethasone, and Manual therapy.  All included unless contraindicated  PLAN FOR NEXT SESSION:   He will need recert next visit!!!!!!! NMES for RTC and deltoid activation, scapular strength progressions as tolerated.    Elsie Ra, PT, DPT 12/03/22 8:17 AM

## 2022-12-07 ENCOUNTER — Encounter: Payer: Self-pay | Admitting: Physical Therapy

## 2022-12-07 ENCOUNTER — Ambulatory Visit (INDEPENDENT_AMBULATORY_CARE_PROVIDER_SITE_OTHER): Payer: Managed Care, Other (non HMO) | Admitting: Physical Therapy

## 2022-12-07 DIAGNOSIS — M25612 Stiffness of left shoulder, not elsewhere classified: Secondary | ICD-10-CM | POA: Diagnosis not present

## 2022-12-07 DIAGNOSIS — M542 Cervicalgia: Secondary | ICD-10-CM

## 2022-12-07 DIAGNOSIS — R600 Localized edema: Secondary | ICD-10-CM

## 2022-12-07 DIAGNOSIS — M25512 Pain in left shoulder: Secondary | ICD-10-CM

## 2022-12-07 NOTE — Therapy (Signed)
OUTPATIENT PHYSICAL THERAPY TREATMENT NOTE RECERTIFICATION   Patient Name: NIKOLAY DEMETRIOU MRN: [id number] DOB:06-Oct-1966, 57 y.o., male Today's Date: 12/07/2022  END OF SESSION:   PT End of Session - 12/07/22 0803     Visit Number 15    Date for PT Re-Evaluation 01/18/23    PT Start Time 0801    PT Stop Time 0840    PT Time Calculation (min) 39 min    Activity Tolerance Patient tolerated treatment well    Behavior During Therapy [facility] for tasks assessed/performed                      Past Medical History:  Diagnosis Date   Kidney stone    x2   Reflux    Past Surgical History:  Procedure Laterality Date   WRIST SURGERY Left 29 years ago   Patient Active Problem List   Diagnosis Date Noted   Nontraumatic complete tear of left rotator cuff 07/10/2022   Impingement syndrome of left shoulder 07/10/2022   Tendinopathy of left biceps tendon 07/10/2022   Traumatic tear of supraspinatus tendon of right shoulder 06/22/2019   Family history of early CAD 07/22/2018   High cholesterol 05/29/2016   Erectile dysfunction 05/14/2015   Prediabetes 01/26/2013   GERD (gastroesophageal reflux disease) 01/12/2013   NEPHROLITHIASIS, HX OF 02/13/2008     THERAPY DIAG:  Acute pain of left shoulder - Plan: PT plan of care cert/re-cert  Stiffness of left shoulder, not elsewhere classified - Plan: PT plan of care cert/re-cert  Localized edema - Plan: PT plan of care cert/re-cert  Cervicalgia - Plan: PT plan of care cert/re-cert   PCP:  Jinny Sanders, MD   REFERRING PROVIDER: Frankey Shown MD  REFERRING DIAG: M75.42 (ICD-10-CM) - Impingement syndrome of left shoulder M75.122 (ICD-10-CM) - Nontraumatic complete tear of left rotator cuff  EVAL THERAPY DIAG:  Acute pain of left shoulder  Stiffness of left shoulder, not elsewhere classified  Facial weakness  Localized edema  Cervicalgia  Rationale for Evaluation and Treatment: Rehabilitation  ONSET DATE: 09/17/22    SUBJECTIVE:  Subjective:  He denies pain upon arrival, feels more like a bruise that is sore than pain in his shoulder.    PERTINENT HISTORY:  LEFT SHOULDER SCOPE, BICEPS TENODESIS, SAD, EXTENSIVE DEBRIDEMENT 09/17/22    Kidney stones, reflux, wrist surgery  PAIN:  NPRS scale: no pain at rest and with flexion movments below 90 degrees, no pain on UBE   PRECAUTIONS: Shoulder  WEIGHT BEARING RESTRICTIONS: No  FALLS:  Has patient fallen in last 6 months? No  LIVING ENVIRONMENT: Lives with: lives with their family and lives with their spouse Lives in: House/apartment Stairs: Yes: External: 3 steps; none Has following equipment at home: None  OCCUPATION:  Owns a farm  PLOF: Independent  PATIENT GOALS: Stop hurting     OBJECTIVE:   DIAGNOSTIC FINDINGS: 07/10/22  MRI of the right shoulder and cervical spine are relatively unremarkable.  The prior rotator cuff repair is intact.  He has some bulging disks with some mild to moderate foraminal narrowing but his symptoms are more consistent with MRI findings of the left shoulder which shows a full-thickness supraspinatus tear with slight retraction.  He has medial dislocation of the biceps tendon.  Unfavorable acromion and significant AC joint arthritis.  Based on these findings I have recommended arthroscopic rotator cuff repair and biceps tenodesis, debridements as indicated.   PATIENT SURVEYS:  10/06/22: FOTO intake:  37%  11/23/22: FOTO 45 12/07/22: FOTO 53  POSTURE: Forward head and rounded shoulder  UPPER EXTREMITY ROM:   ROM Right 10/06/22 Active supine  Left 10/06/22 Passive supine Left 10/19/22 Passive  supine Left 11/03/21 Passive supine Left 11/16/22 AROM/PROM In  standing Left 12/07/22 A/P in standing   Shoulder flexion 160 122 140 154 50/WNL 42/WNL  Shoulder extension  20      Shoulder abduction 162 86  148 50/WNL 60/WNL  Shoulder adduction        Shoulder internal rotation 64 Shoulder abd 45 deg 52 Shoulder abd 45 deg  75 deg(shoulder abd to 45)    Shoulder external rotation 75 60 73  (50 deg abdct) 78  (Shoulder abd 45 deg) 65/WNL 65/WNL  Elbow flexion 126 125      Elbow extension 0 0      (Blank rows = not tested)  CERVICAL active ROM    Cervical flexion: 25 deg Cervical extension; 18 deg with discomfort noted Cervical rotation left:  54 deg Cervical rotation right:  56 deg Cervical side bending left: 22 deg Cervical side bending right:  32 deg   11/03/21:  Cervical flexion: 30 deg Cervical extension; 26 deg with discomfort noted Cervical rotation left:  56 deg Cervical rotation right:  60 deg  12/07/22 Cervical rotation left:  70 deg Cervical rotation right:  60 deg    UPPER EXTREMITY MMT:  MMT Right 10/06/22 Left 1/8//23 Left 11/16/22 Left 12/07/22  Shoulder flexion 5/5 2+ 2+ 2+  Shoulder extension 5/5 5    Shoulder abduction 5/5 2+ 2+ 2+  Shoulder adduction      Shoulder internal rotation 5/5 4+    Shoulder external rotation 5/5 2+ 3 3  (Blank rows = not tested)   PALPATION:  TTP, anterior shoulder, around incision site  TODAY'S TREATMENT:  DATE: 12/07/22 TherEx Standing AA flexion with 1# bar x10 reps Standing AA abduction with 1# bar x 10 reps AROM and strength measurements as noted above  12/03/22 -UE ranger AAROM X 15 circles, flexion, abduction -Pball up wall shoulder flexion AAROM  X10 -NMES/Russian stimulation 5/5 sec on/off with intensity to tolerance X 18 min  while he performs active abduction (5 min in sitting) , flexion (5 min in supine, 3 min sitting) ER (5 min in sidelying during on phase) -Rows blue X 20 -Extensions blue X 20 -IR green X 20 -ER yellow 3X10  11/30/22 -UE ranger AAROM X 15 circles, flexion, abduction -Pball up wall shoulder flexion AAROM  X10 -bicep curls 10# 2X10 bilat -Rows blue X 20 -Extensions blue X 20 -IR green X 20 -ER red 3X10 -Standing shoulder flexion AAROM 1# bar X 15 -wall push ups 2X10 -NMES/Russian stimulation 5/5 sec on/off with intensity to tolerance X 15 min while he performs active abduction (5 min in sidelying) , flexion (5 min in supine) ER (5 min in sidelying during on phase)  11/26/22 TherEx NMES/Russian stimulation 5/5 sec on/off with intensity to tolerance (44m) X 10 min while he performs active flexion in supine and sitting x 5 min each; then AA abduction in sitting with elbow flexion x 10 min Supine 90 deg flexion with 3#, circles CW/CCW x 30 reps each direction Rhythmic Stabilization  Rows L4 band 3x10   PATIENT EDUCATION: Education details: HEP, POC Person educated: Patient Education method: EConsulting civil engineer DMedia planner Verbal cues, and Handouts Education comprehension: verbalized understanding, returned demonstration, and verbal cues required  HOME EXERCISE PROGRAM: Access Code: [email or web address]: [email or web address] Date: 12/07/2022 Prepared by: SFaustino Congress Exercises - Seated Upper Trapezius Stretch  - 2-3 x daily - 7 x weekly - 3 reps - 10 sec hold - Supine Shoulder Flexion with Dowel  - 2 x daily - 7 x weekly - 2 sets - 10 reps - 3-5 sec hold - Supine Shoulder External Rotation in 45 Degrees Abduction AAROM with Dowel  - 2 x daily - 7 x weekly - 2 sets - 10 reps - Isometric Shoulder Flexion with Ball at [employer] - 2 x daily - 7 x weekly - 2 sets - 10 reps - 5 sec hold - Isometric Shoulder Abduction with Ball at [employer] - 2 x daily - 7 x weekly - 2 sets - 10 reps - 5  sec hold - Isometric Shoulder External Rotation with Ball at [employer] - 2 x daily - 7 x weekly - 2 sets - 10 reps - 5 sec hold - Standing Isometric Shoulder Internal Rotation at Wall with Ball  - 2 x daily - 7 x weekly - 2 sets - 10 reps - 5 sec hold - Standing Row with Anchored Resistance  - 2 x daily - 6 x weekly - 2-3 sets - 10 reps - Shoulder extension with resistance - Neutral  - 2 x daily - 6 x weekly - 2-3 sets - 10 reps - Shoulder Internal Rotation with Resistance (Mirrored)  - 2 x daily - 6 x weekly - 2-3 sets - 10 reps - Shoulder External Rotation with Anchored Resistance  - 2 x daily - 6 x weekly - 2-3 sets - 10 reps - Standing Shoulder Flexion ROM with Dowel  - 2-3 x daily - 7 x weekly - 1-2 sets - 10 reps - Standing Shoulder Abduction ROM with Dowel  - 2-3 x daily - 7 x weekly -  1-2 sets - 10 reps - 1-2 sec hold  ASSESSMENT:  CLINICAL IMPRESSION: Pt has demonstrated slow progress towards LTGs at this time.  Revised strength goal as pt unlikely to regain full strength since RTC was not repaired.  Recommend continued PT 1-2x/wk x 6 weeks, but pt concerned about copay and so will continue as pt is able.  OBJECTIVE IMPAIRMENTS: decreased activity tolerance, decreased mobility, decreased ROM, decreased strength, impaired UE functional use, and pain.   ACTIVITY LIMITATIONS: carrying, lifting, sleeping, and reach over head  PARTICIPATION LIMITATIONS: occupation and yard work  PERSONAL FACTORS: see pertinent medical  are also affecting patient's functional outcome.   REHAB POTENTIAL: Good  CLINICAL DECISION MAKING: Stable/uncomplicated  EVALUATION COMPLEXITY: Low   GOALS: Goals reviewed with patient? Yes  SHORT TERM GOALS: (target date for Short term goals are 3 weeks 10/30/22)  1.Patient will demonstrate independent use of home exercise program to maintain progress from in clinic treatments. Goal status: MET 10/30/22  LONG TERM GOALS: (target dates for all long term goals are  6 weeks  01/18/2023)   1. Patient will demonstrate/report pain at worst less than or equal to 2/10 to facilitate minimal limitation in daily activity secondary to pain symptoms. Goal status: MET 12/07/22   2. Patient will demonstrate independent use of home exercise program to facilitate ability to maintain/progress functional gains from skilled physical therapy services. Goal status: ongoing, his HEP is being updated PRN (met to date) 12/07/22   3. Patient will demonstrate FOTO outcome > or = 66 % to indicate reduced disability due to condition. Goal status: ongoing  12/07/22   4.  Patient will demonstrate left UE MMT 5/5 throughout to facilitate lifting, reaching, carrying at [facility] in daily activity.  Goal status: not met, revised  12/07/22    5.  Patient will demonstrate left shoulder flexion >/= 150 degrees without symptoms to facilitate usual overhead reaching, self care, dressing at PLOF.   Goal status: not met, revised 12/07/22   6.  Pt will improve left shoulder ER to >/= 65 degrees in order to facility ADL's.  Goal status: met per 11/16/22 measurement 12/07/22   7. Pt will improve bilateral cervical rotation to >/= 65 degrees for improvements in functional mobility and activities of daily living. Goal status: Partially Met 12/07/22  8.  Patient will demonstrate left UE MMT 3/5 throughout to facilitate lifting, reaching, carrying at [facility] in daily activity.  Goal status: NEW 12/07/22  9.  Patient will demonstrate left shoulder flexion >/= 90 degrees without symptoms to facilitate usual overhead reaching, self care, dressing at PLOF.   Goal status: NEW 12/07/22     PLAN:  PT FREQUENCY: 1-2x/week  PT DURATION: 6 weeks  PLANNED INTERVENTIONS: Therapeutic exercises, Therapeutic activity, Neuro Muscular re-education, Balance training, Gait training, Patient/Family education, Joint mobilization, Stair training, DME instructions, Dry Needling, Electrical stimulation, Traction, Cryotherapy,  vasopneumatic device Moist heat, Taping, Ultrasound, Ionotophoresis '4mg'$ /ml Dexamethasone, and Manual therapy.  All included unless contraindicated  PLAN FOR NEXT SESSION:   Continue to work on maximizing AROM and muscle activation NMES for RTC and deltoid activation, scapular strength progressions as tolerated.    Laureen Abrahams, PT, DPT 12/07/22 9:22 AM

## 2022-12-09 NOTE — Progress Notes (Unsigned)
   Post-Op Visit Note   Patient: Gregory Dorsey           Date of Birth: 1966/08/07           MRN: [id number] Visit Date: 12/10/2022 PCP: Jinny Sanders, MD   Assessment & Plan:  Chief Complaint: No chief complaint on file.  Visit Diagnoses:  1. Nontraumatic complete tear of left rotator cuff     Plan: ***  Follow-Up Instructions: No follow-ups on file.   Orders:  No orders of the defined types were placed in this encounter.  No orders of the defined types were placed in this encounter.   Imaging: No results found.  PMFS History: Patient Active Problem List   Diagnosis Date Noted   Nontraumatic complete tear of left rotator cuff 07/10/2022   Impingement syndrome of left shoulder 07/10/2022   Tendinopathy of left biceps tendon 07/10/2022   Traumatic tear of supraspinatus tendon of right shoulder 06/22/2019   Family history of early CAD 07/22/2018   High cholesterol 05/29/2016   Erectile dysfunction 05/14/2015   Prediabetes 01/26/2013   GERD (gastroesophageal reflux disease) 01/12/2013   NEPHROLITHIASIS, HX OF 02/13/2008   Past Medical History:  Diagnosis Date   Kidney stone    x2   Reflux     Family History  Problem Relation Age of Onset   Arthritis Mother    Heart disease Father 66       CAD   Colon polyps Father    Colon cancer Neg Hx    Esophageal cancer Neg Hx    Rectal cancer Neg Hx    Stomach cancer Neg Hx     Past Surgical History:  Procedure Laterality Date   WRIST SURGERY Left 29 years ago   Social History   Occupational History   Not on file  Tobacco Use   Smoking status: Never   Smokeless tobacco: Never  Vaping Use   Vaping Use: Never used  Substance and Sexual Activity   Alcohol use: No   Drug use: No   Sexual activity: Not on file

## 2022-12-10 ENCOUNTER — Ambulatory Visit (INDEPENDENT_AMBULATORY_CARE_PROVIDER_SITE_OTHER): Payer: 59 | Admitting: Physical Therapy

## 2022-12-10 ENCOUNTER — Ambulatory Visit (INDEPENDENT_AMBULATORY_CARE_PROVIDER_SITE_OTHER): Payer: Managed Care, Other (non HMO) | Admitting: Orthopaedic Surgery

## 2022-12-10 DIAGNOSIS — M25612 Stiffness of left shoulder, not elsewhere classified: Secondary | ICD-10-CM | POA: Diagnosis not present

## 2022-12-10 DIAGNOSIS — M25512 Pain in left shoulder: Secondary | ICD-10-CM | POA: Diagnosis not present

## 2022-12-10 DIAGNOSIS — R600 Localized edema: Secondary | ICD-10-CM

## 2022-12-10 DIAGNOSIS — M542 Cervicalgia: Secondary | ICD-10-CM | POA: Diagnosis not present

## 2022-12-10 DIAGNOSIS — M75122 Complete rotator cuff tear or rupture of left shoulder, not specified as traumatic: Secondary | ICD-10-CM

## 2022-12-10 NOTE — Therapy (Addendum)
OUTPATIENT PHYSICAL THERAPY TREATMENT NOTE/Discharge addendum PHYSICAL THERAPY DISCHARGE SUMMARY  Visits from Start of Care: 16  Current functional level related to goals / functional outcomes: See below   Remaining deficits: See below   Education / Equipment: HEP  Plan:  Patient goals were not met. Patient is being discharged due to not returning since last visit >30 days Elsie Ra, PT, DPT 01/20/23 10:56 AM       Patient Name: SOSTENES KAUFFMANN MRN: [id number] DOB:1966/08/28, 57 y.o., male Today's Date: 12/10/2022  END OF SESSION:   PT End of Session - 12/10/22 0825     Visit Number 16    Number of Visits 28    Date for PT Re-Evaluation 01/18/23    PT Start Time 0801    PT Stop Time 0845    PT Time Calculation (min) 44 min    Activity Tolerance Patient tolerated treatment well    Behavior During Therapy [facility] for tasks assessed/performed                       Past Medical History:  Diagnosis Date   Kidney stone    x2   Reflux    Past Surgical History:  Procedure Laterality Date   WRIST SURGERY Left 29 years ago   Patient Active Problem List   Diagnosis Date Noted   Nontraumatic complete tear of left rotator cuff 07/10/2022   Impingement syndrome of left shoulder 07/10/2022   Tendinopathy of left biceps tendon 07/10/2022   Traumatic tear of supraspinatus tendon of right shoulder 06/22/2019   Family history of early CAD 07/22/2018   High cholesterol 05/29/2016   Erectile dysfunction 05/14/2015   Prediabetes 01/26/2013   GERD (gastroesophageal reflux disease) 01/12/2013   NEPHROLITHIASIS, HX OF 02/13/2008     THERAPY DIAG:  Acute pain of left shoulder  Stiffness of left shoulder, not elsewhere classified  Localized edema  Cervicalgia   PCP:  Jinny Sanders, MD   REFERRING PROVIDER: Frankey Shown MD  REFERRING DIAG: M75.42 (ICD-10-CM) - Impingement syndrome of left shoulder M75.122 (ICD-10-CM) - Nontraumatic complete tear of left  rotator cuff  EVAL THERAPY DIAG:  Acute pain of left shoulder  Stiffness of left shoulder, not elsewhere classified  Facial weakness  Localized edema  Cervicalgia  Rationale for Evaluation and Treatment: Rehabilitation  ONSET DATE: 09/17/22   SUBJECTIVE:  Subjective: See MD this morning.  Shoulder is about the same; both are sore today  PERTINENT HISTORY:  LEFT SHOULDER SCOPE, BICEPS TENODESIS, SAD, EXTENSIVE DEBRIDEMENT 09/17/22    Kidney stones, reflux, wrist surgery  PAIN:  NPRS scale: no pain at rest and with flexion movments below 90 degrees, no pain on UBE  PRECAUTIONS: Shoulder  WEIGHT BEARING RESTRICTIONS: No  FALLS:  Has patient fallen in last 6 months? No  LIVING ENVIRONMENT: Lives with: lives with their family and lives with their spouse Lives in: House/apartment Stairs: Yes: External: 3 steps; none Has following equipment at home: None  OCCUPATION:  Owns a farm  PLOF: Independent  PATIENT GOALS: Stop hurting   OBJECTIVE:   DIAGNOSTIC FINDINGS: 07/10/22  MRI of the right shoulder and cervical spine are relatively unremarkable.  The prior rotator cuff repair is intact.  He has some bulging disks with some mild to moderate foraminal narrowing but his symptoms are more consistent with MRI findings of the left shoulder which shows a full-thickness supraspinatus tear with slight retraction.  He has medial dislocation of the biceps tendon.  Unfavorable acromion and significant AC joint arthritis.  Based on these findings I have recommended arthroscopic rotator cuff repair and biceps tenodesis, debridements as  indicated.   PATIENT SURVEYS:  10/06/22: FOTO intake:  37%  11/23/22: FOTO 45 12/07/22: FOTO 53  POSTURE: Forward head and rounded shoulder  UPPER EXTREMITY ROM:   ROM Right 10/06/22 Active supine Left 10/06/22 Passive supine Left 10/19/22 Passive  supine Left 11/03/21 Passive supine Left 11/16/22 AROM/PROM In  standing Left 12/07/22 A/P in standing   Shoulder flexion 160 122 140 154 50/WNL 42/WNL  Shoulder extension  20      Shoulder abduction 162 86  148 50/WNL 60/WNL  Shoulder adduction        Shoulder internal rotation 64 Shoulder abd 45 deg 52 Shoulder abd 45 deg  75 deg(shoulder abd to 45)    Shoulder external rotation 75 60 73  (50 deg abdct) 78  (Shoulder abd 45 deg) 65/WNL 65/WNL  Elbow flexion 126 125      Elbow extension 0 0      (Blank rows = not tested)  CERVICAL active ROM    Cervical flexion: 25 deg Cervical extension; 18 deg with discomfort noted Cervical rotation left:  54 deg Cervical rotation right:  56 deg Cervical side bending left: 22 deg Cervical side bending right:  32 deg   11/03/21:  Cervical flexion: 30 deg Cervical extension; 26 deg with discomfort noted Cervical rotation left:  56 deg Cervical rotation right:  60 deg  12/07/22 Cervical rotation left:  70 deg Cervical rotation right:  60 deg    UPPER EXTREMITY MMT:  MMT Right 10/06/22 Left 1/8//23 Left 11/16/22 Left 12/07/22  Shoulder flexion 5/5 2+ 2+ 2+  Shoulder extension 5/5 5    Shoulder abduction 5/5 2+ 2+ 2+  Shoulder adduction      Shoulder internal rotation 5/5 4+    Shoulder external rotation 5/5 2+ 3 3  (Blank rows = not tested)   PALPATION:  TTP, anterior shoulder, around incision site  TODAY'S TREATMENT:  DATE: 12/10/22 TherEx Supine flexion x 10 min with NMES and BFR Sidelying abduction x 10 min with NMES and BFR  BFR Settings: Cuff  size 2, LOP supine 140 mmHg, exercise pressure 70 mmHg  Modalities NMES [place] setting 2 x 10 min with 5 sec on/5 sec off to anterior deltoid, then middle deltoid with exercises as noted above  12/07/22 TherEx Standing AA flexion with 1# bar x10 reps Standing AA abduction with 1# bar x 10 reps AROM and strength measurements as noted above  12/03/22 -UE ranger AAROM X 15 circles, flexion, abduction -Pball up wall shoulder flexion AAROM  X10 -NMES/Russian stimulation 5/5 sec on/off with intensity to tolerance X 18 min while he performs active abduction (5 min in sitting) , flexion (5 min in supine, 3 min sitting) ER (5 min in sidelying during on phase) -Rows blue X 20 -Extensions blue X 20 -IR green X 20 -ER yellow 3X10  11/30/22 -UE ranger AAROM X 15 circles, flexion, abduction -Pball up wall shoulder flexion AAROM  X10 -bicep curls 10# 2X10 bilat -Rows blue X 20 -Extensions blue X 20 -IR green X 20 -ER red 3X10 -Standing shoulder flexion AAROM 1# bar X 15 -wall push ups 2X10 -NMES/Russian stimulation 5/5 sec on/off with intensity to tolerance X 15 min while he performs active abduction (5 min in sidelying) , flexion (5 min in supine) ER (5 min in sidelying during on phase)  PATIENT EDUCATION: Education details: HEP, POC Person educated: Patient Education method: Consulting civil engineer, Demonstration, Verbal cues, and Handouts Education comprehension: verbalized understanding, returned demonstration, and verbal cues required  HOME EXERCISE PROGRAM: Access Code: [email or web address] URL: [email or web address] Date: 12/07/2022 Prepared by: Faustino Congress  Exercises - Seated Upper Trapezius Stretch  - 2-3 x daily - 7 x weekly - 3 reps - 10 sec hold - Supine Shoulder Flexion with Dowel  - 2 x daily - 7 x weekly - 2 sets - 10 reps - 3-5 sec hold - Supine Shoulder External Rotation in 45 Degrees Abduction AAROM with Dowel  - 2 x daily - 7 x weekly - 2 sets - 10 reps - Isometric  Shoulder Flexion with Ball at [employer]  - 2 x daily - 7 x weekly - 2 sets - 10 reps - 5 sec hold - Isometric Shoulder Abduction with Ball at [employer]  - 2 x daily - 7 x weekly - 2 sets - 10 reps - 5 sec hold - Isometric Shoulder External Rotation with Ball at [employer]  - 2 x daily - 7 x weekly - 2 sets - 10 reps - 5 sec hold - Standing Isometric Shoulder Internal Rotation at Wall with Ball  - 2 x daily - 7 x weekly - 2 sets - 10 reps - 5 sec hold - Standing Row with Anchored Resistance  - 2 x daily - 6 x weekly - 2-3 sets - 10 reps - Shoulder extension with resistance - Neutral  - 2 x daily - 6 x weekly - 2-3 sets - 10 reps - Shoulder Internal Rotation with Resistance (Mirrored)  - 2 x daily - 6 x weekly - 2-3 sets - 10 reps - Shoulder External Rotation with Anchored Resistance  - 2 x daily - 6 x weekly - 2-3 sets - 10 reps - Standing Shoulder Flexion ROM with Dowel  - 2-3 x daily - 7 x weekly - 1-2 sets - 10 reps - Standing Shoulder Abduction ROM with Dowel  - 2-3 x daily - 7 x weekly -  1-2 sets - 10 reps - 1-2 sec hold  ASSESSMENT:  CLINICAL IMPRESSION: Pt slow to progress with active motion of Lt shoulder.  Overall PROM is excellent he is just limited in the strength to actively move the shoulder.  Trial of BFR with NMES today and plan to continue to see if this can help progress strength.  Will continue to benefit from PT to maximize function.  OBJECTIVE IMPAIRMENTS: decreased activity tolerance, decreased mobility, decreased ROM, decreased strength, impaired UE functional use, and pain.   ACTIVITY LIMITATIONS: carrying, lifting, sleeping, and reach over head  PARTICIPATION LIMITATIONS: occupation and yard work  PERSONAL FACTORS: see pertinent medical  are also affecting patient's functional outcome.   REHAB POTENTIAL: Good  CLINICAL DECISION MAKING: Stable/uncomplicated  EVALUATION COMPLEXITY: Low   GOALS: Goals reviewed with patient? Yes  SHORT TERM GOALS: (target date for Short term  goals are 3 weeks 10/30/22)  1.Patient will demonstrate independent use of home exercise program to maintain progress from in clinic treatments. Goal status: MET 10/30/22  LONG TERM GOALS: (target dates for all long term goals are 6 weeks  01/18/2023)   1. Patient will demonstrate/report pain at worst less than or equal to 2/10 to facilitate minimal limitation in daily activity secondary to pain symptoms. Goal status: MET 12/07/22   2. Patient will demonstrate independent use of home exercise program to facilitate ability to maintain/progress functional gains from skilled physical therapy services. Goal status: ongoing, his HEP is being updated PRN (met to date) 12/07/22   3. Patient will demonstrate FOTO outcome > or = 66 % to indicate reduced disability due to condition. Goal status: ongoing  12/07/22   4.  Patient will demonstrate left UE MMT 5/5 throughout to facilitate lifting, reaching, carrying at [facility] in daily activity.  Goal status: not met, revised  12/07/22    5.  Patient will demonstrate left shoulder flexion >/= 150 degrees without symptoms to facilitate usual overhead reaching, self care, dressing at PLOF.   Goal status: not met, revised 12/07/22   6.  Pt will improve left shoulder ER to >/= 65 degrees in order to facility ADL's.  Goal status: met per 11/16/22 measurement 12/07/22   7. Pt will improve bilateral cervical rotation to >/= 65 degrees for improvements in functional mobility and activities of daily living. Goal status: Partially Met 12/07/22  8.  Patient will demonstrate left UE MMT 3/5 throughout to facilitate lifting, reaching, carrying at [facility] in daily activity.  Goal status: NEW 12/07/22  9.  Patient will demonstrate left shoulder flexion >/= 90 degrees without symptoms to facilitate usual overhead reaching, self care, dressing at PLOF.   Goal status: NEW 12/07/22     PLAN:  PT FREQUENCY: 1-2x/week  PT DURATION: 6 weeks  PLANNED INTERVENTIONS: Therapeutic exercises,  Therapeutic activity, Neuro Muscular re-education, Balance training, Gait training, Patient/Family education, Joint mobilization, Stair training, DME instructions, Dry Needling, Electrical stimulation, Traction, Cryotherapy, vasopneumatic device Moist heat, Taping, Ultrasound, Ionotophoresis 4mg /ml Dexamethasone, and Manual therapy.  All included unless contraindicated  PLAN FOR NEXT SESSION:   How was BFR, and continue if tolerated okay Continue to work on maximizing AROM and muscle activation NMES for RTC and deltoid activation, scapular strength progressions as tolerated.    Laureen Abrahams, PT, DPT 12/10/22 8:37 AM

## 2022-12-14 ENCOUNTER — Encounter: Payer: 59 | Admitting: Physical Therapy

## 2022-12-17 ENCOUNTER — Encounter: Payer: 59 | Admitting: Physical Therapy

## 2022-12-18 ENCOUNTER — Ambulatory Visit (INDEPENDENT_AMBULATORY_CARE_PROVIDER_SITE_OTHER): Payer: Managed Care, Other (non HMO) | Admitting: Physical Medicine and Rehabilitation

## 2022-12-18 DIAGNOSIS — G8929 Other chronic pain: Secondary | ICD-10-CM

## 2022-12-18 DIAGNOSIS — R531 Weakness: Secondary | ICD-10-CM | POA: Diagnosis not present

## 2022-12-18 DIAGNOSIS — R29898 Other symptoms and signs involving the musculoskeletal system: Secondary | ICD-10-CM

## 2022-12-18 NOTE — Progress Notes (Signed)
Gregory Dorsey - 57 y.o. male MRN [id number]  Date of birth: July 18, 1966  Office Visit Note: Visit Date: 12/18/2022 PCP: Jinny Sanders, MD Referred by: Leandrew Koyanagi, MD  Subjective: Chief Complaint  Patient presents with   Left Arm - Weakness   HPI:  Gregory Dorsey is a 57 y.o. male who comes in today at the request of Dr. Eduard Roux for evaluation and management of chronic, worsening and severe pain, numbness and tingling in the Left upper extremities.  Patient is Right hand dominant.  Patient is status post shoulder arthroscopy for nontraumatic complete tear of the rotator cuff and impingement syndrome and tendinopathy of the left biceps tendon.  Postsurgery has had weakness and ability to fully abduct or get the arm over head.  He notes no tingling or paresthesias.  Nothing down the arm.   ROS Otherwise per HPI.  Assessment & Plan: Visit Diagnoses:    ICD-10-CM   1. Weakness  R53.1 NCV with EMG (electromyography)    2. Chronic left shoulder pain  M25.512    G89.29     3. Weakness of shoulder  R29.898       Plan: Impression: Essentially NORMAL electrodiagnostic study of the left upper limb.  There is no significant electrodiagnostic evidence of nerve entrapment (particularly axillary), brachial plexopathy or cervical radiculopathy.    As you know, purely sensory or demyelinating radiculopathies and chemical radiculitis may not be detected with this particular electrodiagnostic study.  Recommendations: 1.  Follow-up with referring physician. 2.  Continue current management of symptoms.  Meds & Orders: No orders of the defined types were placed in this encounter.   Orders Placed This Encounter  Procedures   NCV with EMG (electromyography)    Follow-up: Return for  Eduard Roux, MD.   Procedures: No procedures performed  EMG & NCV Findings: Evaluation of the left median motor nerve showed prolonged distal onset latency (4.4 ms).  The left radial motor nerve showed  decreased conduction velocity (Up Arm-8cm, 58 m/s).  All remaining nerves (as indicated in the following tables) were within normal limits.    All examined muscles (as indicated in the following table) showed no evidence of electrical instability.    Impression: Essentially NORMAL electrodiagnostic study of the left upper limb.  There is no significant electrodiagnostic evidence of nerve entrapment (particularly axillary), brachial plexopathy or cervical radiculopathy.    As you know, purely sensory or demyelinating radiculopathies and chemical radiculitis may not be detected with this particular electrodiagnostic study.  Recommendations: 1.  Follow-up with referring physician. 2.  Continue current management of symptoms.  ___________________________ Laurence Spates FAAPMR Board Certified, American Board of Physical Medicine and Rehabilitation    Nerve Conduction Studies Anti Sensory Summary Table   Stim Site NR Peak (ms) Norm Peak (ms) P-T Amp (V) Norm P-T Amp Site1 Site2 Delta-P (ms) Dist (cm) Vel (m/s) Norm Vel (m/s)  Left Median Acr Palm Anti Sensory (2nd Digit)  31.3C  Wrist    3.6 <3.6 18.7 >10 Wrist Palm 1.7 0.0    Palm    1.9 <2.0 23.0         Left Radial Anti Sensory (Base 1st Digit)  31C  Wrist    2.3 <3.1 29.4  Wrist Base 1st Digit 2.3 0.0    Left Ulnar Anti Sensory (5th Digit)  31.7C  Wrist    3.7 <3.7 18.8 >15.0 Wrist 5th Digit 3.7 14.0 38 >38   Motor Summary Table   Stim  Site NR Onset (ms) Norm Onset (ms) O-P Amp (mV) Norm O-P Amp Site1 Site2 Delta-0 (ms) Dist (cm) Vel (m/s) Norm Vel (m/s)  Left Axillary Motor (Deltoid)  27.6C  Clavicle    4.1 <5 2.5  Clavicle Deltoid 4.1 25.0 61 50  Left Median Motor (Abd Poll Brev)  31.4C  Wrist    *4.4 <4.2 5.5 >5 Elbow Wrist 4.3 0.0  >50  Elbow    8.7  5.0         Left Radial Motor (Ext Indicis)  29C  8cm    2.5 <2.5 3.1 >1.7 Up Arm 8cm 4.2 24.5 *58 >60  Up Arm    6.7  3.5          EMG   Side Muscle Nerve Root Ins Act  Fibs Psw Amp Dur Poly Recrt Int Fraser Din Comment  Left 1stDorInt Ulnar C8-T1 Nml Nml Nml Nml Nml 0 Nml Nml   Left ExtIndicis Radial (Post Int) C7-8 Nml Nml Nml Nml Nml 0 Nml Nml   Left ExtDigCom   Nml Nml Nml Nml Nml 0 Nml Nml   Left BrachioRad Radial C5-6 Nml Nml Nml Nml Nml 0 Nml Nml   Left Triceps Radial C6-7-8 Nml Nml Nml Nml Nml 0 Nml Nml   Left Deltoid Axillary C5-6 Nml Nml Nml Nml Nml 0 Nml Nml   Left Supraspinatus SupraScap C5-6 Nml Nml Nml Nml Nml 0 Nml Nml     Nerve Conduction Studies Anti Sensory Left/Right Comparison   Stim Site L Lat (ms) R Lat (ms) L-R Lat (ms) L Amp (V) R Amp (V) L-R Amp (%) Site1 Site2 L Vel (m/s) R Vel (m/s) L-R Vel (m/s)  Median Acr Palm Anti Sensory (2nd Digit)  31.3C  Wrist 3.6   18.7   Wrist Palm     Palm 1.9   23.0         Radial Anti Sensory (Base 1st Digit)  31C  Wrist 2.3   29.4   Wrist Base 1st Digit     Ulnar Anti Sensory (5th Digit)  31.7C  Wrist 3.7   18.8   Wrist 5th Digit 38     Motor Left/Right Comparison   Stim Site L Lat (ms) R Lat (ms) L-R Lat (ms) L Amp (mV) R Amp (mV) L-R Amp (%) Site1 Site2 L Vel (m/s) R Vel (m/s) L-R Vel (m/s)  Axillary Motor (Deltoid)  27.6C  Clavicle 4.1   2.5   Clavicle Deltoid 61    Median Motor (Abd Poll Brev)  31.4C  Wrist *4.4   5.5   Elbow Wrist     Elbow 8.7   5.0         Radial Motor (Ext Indicis)  29C  8cm 2.5   3.1   Up Arm 8cm *58    Up Arm 6.7   3.5            Waveforms:             Clinical History: No specialty comments available.     Objective:  VS:  HT:    WT:   BMI:     BP:   HR: bpm  TEMP: ( )  RESP:  Physical Exam Musculoskeletal:        General: Tenderness present.     Comments: Inspection reveals no atrophy of the bilateral APB or FDI or hand intrinsics. There is no swelling, color changes, allodynia or dystrophic changes.  Inspection also reveals well-healed surgical scar  over the left shoulder.  There is does appear to be some atrophy of the deltoid and  surrounding musculature compared to the right.  There is 5 out of 5 strength in the bilateral wrist extension, finger abduction and long finger flexion. There is intact sensation to light touch in all dermatomal and peripheral nerve distributions.  He can abduct the left arm but not very far and almost appears to have a mechanical stop.  There is a negative Hoffmann's test bilaterally.  Skin:    General: Skin is warm and dry.     Findings: No erythema or rash.  Neurological:     General: No focal deficit present.     Mental Status: He is alert and oriented to person, place, and time.     Cranial Nerves: No cranial nerve deficit.     Sensory: No sensory deficit.     Motor: Weakness present. No abnormal muscle tone.     Coordination: Coordination normal.     Gait: Gait normal.  Psychiatric:        Mood and Affect: Mood normal.        Behavior: Behavior normal.        Thought Content: Thought content normal.      Imaging: No results found.

## 2022-12-18 NOTE — Progress Notes (Signed)
Functional Pain Scale - descriptive words and definitions  No Pain (0)   No Pain/Loss of function  Average Pain  pain when lifting arm  Left pain weakness

## 2022-12-18 NOTE — Procedures (Signed)
EMG & NCV Findings: Evaluation of the left median motor nerve showed prolonged distal onset latency (4.4 ms).  The left radial motor nerve showed decreased conduction velocity (Up Arm-8cm, 58 m/s).  All remaining nerves (as indicated in the following tables) were within normal limits.    All examined muscles (as indicated in the following table) showed no evidence of electrical instability.    Impression: Essentially NORMAL electrodiagnostic study of the left upper limb.  There is no significant electrodiagnostic evidence of nerve entrapment (particularly axillary), brachial plexopathy or cervical radiculopathy.    As you know, purely sensory or demyelinating radiculopathies and chemical radiculitis may not be detected with this particular electrodiagnostic study.  Recommendations: 1.  Follow-up with referring physician. 2.  Continue current management of symptoms.  ___________________________ Laurence Spates FAAPMR Board Certified, American Board of Physical Medicine and Rehabilitation    Nerve Conduction Studies Anti Sensory Summary Table   Stim Site NR Peak (ms) Norm Peak (ms) P-T Amp (V) Norm P-T Amp Site1 Site2 Delta-P (ms) Dist (cm) Vel (m/s) Norm Vel (m/s)  Left Median Acr Palm Anti Sensory (2nd Digit)  31.3C  Wrist    3.6 <3.6 18.7 >10 Wrist Palm 1.7 0.0    Palm    1.9 <2.0 23.0         Left Radial Anti Sensory (Base 1st Digit)  31C  Wrist    2.3 <3.1 29.4  Wrist Base 1st Digit 2.3 0.0    Left Ulnar Anti Sensory (5th Digit)  31.7C  Wrist    3.7 <3.7 18.8 >15.0 Wrist 5th Digit 3.7 14.0 38 >38   Motor Summary Table   Stim Site NR Onset (ms) Norm Onset (ms) O-P Amp (mV) Norm O-P Amp Site1 Site2 Delta-0 (ms) Dist (cm) Vel (m/s) Norm Vel (m/s)  Left Axillary Motor (Deltoid)  27.6C  Clavicle    4.1 <5 2.5  Clavicle Deltoid 4.1 25.0 61 50  Left Median Motor (Abd Poll Brev)  31.4C  Wrist    *4.4 <4.2 5.5 >5 Elbow Wrist 4.3 0.0  >50  Elbow    8.7  5.0         Left Radial  Motor (Ext Indicis)  29C  8cm    2.5 <2.5 3.1 >1.7 Up Arm 8cm 4.2 24.5 *58 >60  Up Arm    6.7  3.5          EMG   Side Muscle Nerve Root Ins Act Fibs Psw Amp Dur Poly Recrt Int Fraser Din Comment  Left 1stDorInt Ulnar C8-T1 Nml Nml Nml Nml Nml 0 Nml Nml   Left ExtIndicis Radial (Post Int) C7-8 Nml Nml Nml Nml Nml 0 Nml Nml   Left ExtDigCom   Nml Nml Nml Nml Nml 0 Nml Nml   Left BrachioRad Radial C5-6 Nml Nml Nml Nml Nml 0 Nml Nml   Left Triceps Radial C6-7-8 Nml Nml Nml Nml Nml 0 Nml Nml   Left Deltoid Axillary C5-6 Nml Nml Nml Nml Nml 0 Nml Nml   Left Supraspinatus SupraScap C5-6 Nml Nml Nml Nml Nml 0 Nml Nml     Nerve Conduction Studies Anti Sensory Left/Right Comparison   Stim Site L Lat (ms) R Lat (ms) L-R Lat (ms) L Amp (V) R Amp (V) L-R Amp (%) Site1 Site2 L Vel (m/s) R Vel (m/s) L-R Vel (m/s)  Median Acr Palm Anti Sensory (2nd Digit)  31.3C  Wrist 3.6   18.7   Wrist Palm     Palm 1.9  23.0         Radial Anti Sensory (Base 1st Digit)  31C  Wrist 2.3   29.4   Wrist Base 1st Digit     Ulnar Anti Sensory (5th Digit)  31.7C  Wrist 3.7   18.8   Wrist 5th Digit 38     Motor Left/Right Comparison   Stim Site L Lat (ms) R Lat (ms) L-R Lat (ms) L Amp (mV) R Amp (mV) L-R Amp (%) Site1 Site2 L Vel (m/s) R Vel (m/s) L-R Vel (m/s)  Axillary Motor (Deltoid)  27.6C  Clavicle 4.1   2.5   Clavicle Deltoid 61    Median Motor (Abd Poll Brev)  31.4C  Wrist *4.4   5.5   Elbow Wrist     Elbow 8.7   5.0         Radial Motor (Ext Indicis)  29C  8cm 2.5   3.1   Up Arm 8cm *58    Up Arm 6.7   3.5            Waveforms:

## 2023-03-25 ENCOUNTER — Encounter: Payer: Self-pay | Admitting: Physician Assistant

## 2023-03-25 ENCOUNTER — Other Ambulatory Visit (INDEPENDENT_AMBULATORY_CARE_PROVIDER_SITE_OTHER): Payer: Managed Care, Other (non HMO)

## 2023-03-25 ENCOUNTER — Ambulatory Visit: Payer: Managed Care, Other (non HMO) | Admitting: Physician Assistant

## 2023-03-25 ENCOUNTER — Telehealth: Payer: Self-pay

## 2023-03-25 DIAGNOSIS — G8929 Other chronic pain: Secondary | ICD-10-CM | POA: Diagnosis not present

## 2023-03-25 DIAGNOSIS — M545 Low back pain, unspecified: Secondary | ICD-10-CM | POA: Diagnosis not present

## 2023-03-25 MED ORDER — METHYLPREDNISOLONE 4 MG PO TBPK: ORAL_TABLET | ORAL | 0 refills | Status: DC

## 2023-03-25 MED ORDER — TRAMADOL HCL 50 MG PO TABS: 50.0000 mg | ORAL_TABLET | Freq: Four times a day (QID) | ORAL | 0 refills | Status: DC | PRN

## 2023-03-25 NOTE — Telephone Encounter (Signed)
Patients wife called stating that patient is having some terrible muscle spasms in his lower back that is radiating down into his buttock and leg.  Would like a call back to discuss or if patient needs to be seen.  Cb# [phone].  Please advise.  Thank you

## 2023-03-25 NOTE — Telephone Encounter (Signed)
Scheduled patient for this afternoon with [place].

## 2023-03-25 NOTE — Telephone Encounter (Signed)
Ok thanks 

## 2023-03-25 NOTE — Progress Notes (Signed)
Office Visit Note   Patient: Gregory Dorsey           Date of Birth: 10/02/1966           MRN: [id number] Visit Date: 03/25/2023              Requested by: Excell Seltzer, MD [address] [place],  [place] [address] PCP: Excell Seltzer, MD  Chief Complaint  Patient presents with   Lower Back - Pain      HPI: Patient is a pleasant 57 year old gentleman with a chief complaint of low back pain that radiates down his left side.  He says it started a couple days ago.  Has had some back issues in the past but none recently and none significant.  He says he went to a chiropractor which seem to make it worse.  Now he is having acute severe pain that runs from his left buttock down his right leg to his foot.  No loss of bowel or bladder control.  He has trouble even sitting on his left side.  No groin pain.  He tried taking ibuprofen which is minimally helpful he also had some muscle relaxants leftover from a previous surgery that helped minimally as well  Assessment & Plan: Visit Diagnoses:  1. Chronic left-sided low back pain, unspecified whether sciatica present     Plan: Findings consistent with sciatica and radiculopathy.  His strength is good and he is neurovascular intact.  He is obviously in significant difficult comfort.  I think he would benefit from a Medrol Dosepak.  He understands to take this with food and discontinue the ibuprofen.  When he finishes that he should resume the ibuprofen.  Also have called him in a few tramadol just to make him comfortable until the steroid kicks in.  He knows if he gets worse to call me right away.  If he does not get better he will contact me would consider an MRI considering his level of discomfort  Follow-Up Instructions: No follow-ups on file.   Ortho Exam  Patient is alert, oriented, no adenopathy, well-dressed, normal affect, normal respiratory effort. Examination patient alters positions because of pain.  He will not sit on his  left buttock.  No pain with hip range of motion no redness no deformity in his spine.  He has good strength which is equal to his right side with resisted dorsiflexion plantarflexion extension and flexion of his legs.  He is neurovascularly intact.  He does have reproduction of his pain running down his buttock down his leg with straight leg raise  Imaging: XR Lumbar Spine 2-3 Views  Result Date: 03/25/2023 2 view radiographs of his lumbar spine were taken today.  Well-maintained alignment without any listhesis.  No acute fractures noted.  He does have some degenerative changes most noted L5-S1 with endplate spurring and sclerotic changes no acute fractures  No images are attached to the encounter.  Labs: Lab Results  Component Value Date   HGBA1C 5.7 08/07/2022   HGBA1C 5.5 08/05/2021   HGBA1C 5.4 05/12/2016     Lab Results  Component Value Date   ALBUMIN 4.8 08/07/2022   ALBUMIN 4.6 08/05/2021   ALBUMIN 4.8 06/17/2021    No results found for: "MG" No results found for: "VD25OH"  No results found for: "PREALBUMIN"    Latest Ref Rng & Units 06/17/2021    6:32 PM 05/14/2015    8:17 AM 01/09/2013    2:01 PM  CBC EXTENDED  WBC 4.0 - 10.5 K/uL 16.6  8.3  12.3   RBC 4.22 - 5.81 MIL/uL 5.31  5.47  5.50   Hemoglobin 13.0 - 17.0 g/dL 16.1  09.6  04.5   HCT 39.0 - 52.0 % 47.0  47.9  46.4   Platelets 150 - 400 K/uL 241  224.0  249   NEUT# 1.4 - 7.7 K/uL  5.6  9.5   Lymph# 0.7 - 4.0 K/uL  1.8  1.4      There is no height or weight on file to calculate BMI.  Orders:  Orders Placed This Encounter  Procedures   XR Lumbar Spine 2-3 Views   No orders of the defined types were placed in this encounter.    Procedures: No procedures performed  Clinical Data: No additional findings.  ROS:  All other systems negative, except as noted in the HPI. Review of Systems  Objective: Vital Signs: There were no vitals taken for this visit.  Specialty Comments:  No specialty  comments available.  PMFS History: Patient Active Problem List   Diagnosis Date Noted   Nontraumatic complete tear of left rotator cuff 07/10/2022   Impingement syndrome of left shoulder 07/10/2022   Tendinopathy of left biceps tendon 07/10/2022   Traumatic tear of supraspinatus tendon of right shoulder 06/22/2019   Family history of early CAD 07/22/2018   High cholesterol 05/29/2016   Erectile dysfunction 05/14/2015   Prediabetes 01/26/2013   GERD (gastroesophageal reflux disease) 01/12/2013   NEPHROLITHIASIS, HX OF 02/13/2008   Past Medical History:  Diagnosis Date   Kidney stone    x2   Reflux     Family History  Problem Relation Age of Onset   Arthritis Mother    Heart disease Father 6       CAD   Colon polyps Father    Colon cancer Neg Hx    Esophageal cancer Neg Hx    Rectal cancer Neg Hx    Stomach cancer Neg Hx     Past Surgical History:  Procedure Laterality Date   WRIST SURGERY Left 29 years ago   Social History   Occupational History   Not on file  Tobacco Use   Smoking status: Never   Smokeless tobacco: Never  Vaping Use   Vaping Use: Never used  Substance and Sexual Activity   Alcohol use: No   Drug use: No   Sexual activity: Not on file

## 2023-03-26 ENCOUNTER — Telehealth: Payer: Self-pay | Admitting: Orthopedic Surgery

## 2023-03-26 MED ORDER — HYDROCODONE-ACETAMINOPHEN 5-325 MG PO TABS: 1.0000 | ORAL_TABLET | ORAL | 0 refills | Status: DC | PRN

## 2023-03-26 NOTE — Telephone Encounter (Signed)
Orthopedic Telephone Call  Spoke to patient's wife this evening.  Patient is in severe pain and saw [place] yesterday.  She prescribed a Medrol Dosepak and tramadol.  Per patient's wife, she told them that if the tramadol did not work then [employer] would be a treatment to try.  They are interested in trying the Norco since the Medrol Dosepak and tramadol is providing inadequate relief.  Pain is severe and it is rating down the entire leg.  I explained that there are risks with narcotics especially addiction.  I offered Lyrica or gabapentin as alternatives.  They wanted to try the Norco so a prescription was sent in today. Told them to keep taking the Medrol Dosepak and use tylenol as well.

## 2023-03-27 ENCOUNTER — Other Ambulatory Visit: Payer: Self-pay

## 2023-03-27 ENCOUNTER — Emergency Department (HOSPITAL_COMMUNITY)
Admission: EM | Admit: 2023-03-27 | Discharge: 2023-03-28 | Disposition: A | Discharge status: Home / Self Care | Payer: Managed Care, Other (non HMO) | Attending: Emergency Medicine | Admitting: Emergency Medicine

## 2023-03-27 ENCOUNTER — Encounter (HOSPITAL_COMMUNITY): Payer: Self-pay | Admitting: Pharmacy Technician

## 2023-03-27 DIAGNOSIS — M5442 Lumbago with sciatica, left side: Secondary | ICD-10-CM | POA: Diagnosis not present

## 2023-03-27 DIAGNOSIS — M545 Low back pain, unspecified: Secondary | ICD-10-CM | POA: Diagnosis present

## 2023-03-27 MED ORDER — HYDROMORPHONE HCL 1 MG/ML IJ SOLN
1.0000 mg | Freq: Once | INTRAMUSCULAR | Status: AC
  Administered 2023-03-28: 1 mg via INTRAMUSCULAR
  Filled 2023-03-27: qty 1

## 2023-03-27 MED ORDER — METHOCARBAMOL 1000 MG/10ML IJ SOLN
1000.0000 mg | Freq: Once | INTRAMUSCULAR | Status: DC
  Administered 2023-03-28: 1000 mg via INTRAMUSCULAR
  Filled 2023-03-27: qty 10

## 2023-03-27 NOTE — ED Provider Notes (Signed)
  [place] [facility] AT [facility] Provider Note   CSN: [id number] Arrival date & time: 03/27/23  1752     History {Add pertinent medical, surgical, social history, OB history to HPI:1} Chief Complaint  Patient presents with   Back Pain    Gregory Dorsey is a 57 y.o. male.   Back Pain      Home Medications Prior to Admission medications   Medication Sig Start Date End Date Taking? Authorizing Provider  HYDROcodone-acetaminophen (NORCO/VICODIN) 5-325 MG tablet Take 1 tablet by mouth every 4 (four) hours as needed for up to 5 days for moderate pain. 03/26/23 03/31/23  London Sheer, MD  methocarbamol (ROBAXIN) 500 MG tablet Take 1 tablet (500 mg total) by mouth every 8 (eight) hours as needed for muscle spasms. 10/02/22   Magnant, Joycie Peek, PA-C  methylPREDNISolone (MEDROL DOSEPAK) 4 MG TBPK tablet Take as directed with food 03/25/23   Persons, [place], PA  ondansetron (ZOFRAN) 4 MG tablet Take 1 tablet (4 mg total) by mouth every 8 (eight) hours as needed for nausea or vomiting. Patient not taking: Reported on 12/18/2022 09/11/22   Cristie Hem, PA-C  oxyCODONE-acetaminophen (PERCOCET) 5-325 MG tablet Take 1 tablet by mouth every 6 (six) hours as needed. To be taken after surgery Patient not taking: Reported on 12/18/2022 09/11/22   Cristie Hem, PA-C  traMADol (ULTRAM) 50 MG tablet Take 1 tablet (50 mg total) by mouth every 6 (six) hours as needed. 03/25/23   Persons, [place], PA      Allergies    Patient has no known allergies.    Review of Systems   Review of Systems  Musculoskeletal:  Positive for back pain.    Physical Exam Updated Vital Signs BP (!) 148/99   Pulse 77   Temp 98.1 F (36.7 C) (Oral)   Resp 18   SpO2 99%  Physical Exam  ED Results / Procedures / Treatments   Labs (all labs ordered are listed, but only abnormal results are displayed) Labs Reviewed - No data to display  EKG None  Radiology No results  found.  Procedures Procedures  {Document cardiac monitor, telemetry assessment procedure when appropriate:1}  Medications Ordered in ED Medications - No data to display  ED Course/ Medical Decision Making/ A&P   {   Click here for ABCD2, HEART and other calculatorsREFRESH Note before signing :1}                          Medical Decision Making  ***  {Document critical care time when appropriate:1} {Document review of labs and clinical decision tools ie heart score, Chads2Vasc2 etc:1}  {Document your independent review of radiology images, and any outside records:1} {Document your discussion with family members, caretakers, and with consultants:1} {Document social determinants of health affecting pt's care:1} {Document your decision making why or why not admission, treatments were needed:1} Final Clinical Impression(s) / ED Diagnoses Final diagnoses:  None    Rx / DC Orders ED Discharge Orders     None

## 2023-03-27 NOTE — ED Triage Notes (Signed)
Pt here with L sided lower back pain onset Wednesday. Went to chiropractor and that seemed to aggravate symptoms. Seen at ortho on Thursday and had xrays done. Ortho suspicious of Sciatica. Pt states despite medication pain is worsening and now he is endorsing L foot numbness today. Denies incontinence.

## 2023-03-28 ENCOUNTER — Emergency Department (HOSPITAL_COMMUNITY): Payer: Managed Care, Other (non HMO)

## 2023-03-28 MED ORDER — METHOCARBAMOL 500 MG PO TABS: 1000.0000 mg | ORAL_TABLET | Freq: Once | ORAL | Status: DC

## 2023-03-28 MED ORDER — GABAPENTIN 100 MG PO CAPS: 100.0000 mg | ORAL_CAPSULE | Freq: Three times a day (TID) | ORAL | 0 refills | Status: DC

## 2023-03-28 MED ORDER — HYDROMORPHONE HCL 1 MG/ML IJ SOLN
1.0000 mg | Freq: Once | INTRAMUSCULAR | Status: AC
  Administered 2023-03-28: 1 mg via INTRAMUSCULAR
  Filled 2023-03-28: qty 1

## 2023-03-28 MED ORDER — OXYCODONE-ACETAMINOPHEN 5-325 MG PO TABS: 1.0000 | ORAL_TABLET | ORAL | 0 refills | Status: DC | PRN

## 2023-03-28 NOTE — Discharge Instructions (Addendum)
Take the prescribed medication as directed.  Take this or the hydrocodone but not both.  Continue your medrol dosepak. Follow-up with neurosurgery-- call their office to get this arranged.  They will be able to see your MRI results/images. Return to the ED for new or worsening symptoms.

## 2023-03-28 NOTE — ED Provider Notes (Signed)
  Assumed care at shift change.  See prior note for full H&P.  Briefly, 57 year old male here with worsening back pain, radiation down the left leg.  No incontinence.  This began after seeing a chiropractor last week.  He then saw orthopedics who placed him on hydrocodone and Medrol Dosepak.  He has not had much relief from this.  Plan: MRI pending.  Follow-up results.  MR LUMBAR SPINE WO CONTRAST  Result Date: 03/28/2023 CLINICAL DATA:  Lumbar radiculopathy. Left leg pain extending to the toes EXAM: MRI LUMBAR SPINE WITHOUT CONTRAST TECHNIQUE: Multiplanar, multisequence MR imaging of the lumbar spine was performed. No intravenous contrast was administered. COMPARISON:  None Available. FINDINGS: Intermittent mild motion artifact. Segmentation: Standard. Alignment:  Physiologic. Vertebrae:  No fracture, evidence of discitis, or bone lesion. Conus medullaris and cauda equina: Conus extends to the L1-2 level. Conus and cauda equina appear normal. Paraspinal and other soft tissues: Negative for perispinal mass or inflammation Disc levels: T12- L1: Mild spondylosis L1-L2: Mild facet spurring L2-L3: Mild disc bulging and facet spurring. Small bilateral inferior foraminal protrusion without impingement L3-L4: Disc narrowing and bulging with biforaminal protrusion. Mild facet spurring. The canal and foramina are patent L4-L5: Disc narrowing and bulging with left foraminal protrusion and annular fissure. Degenerative facet spurring on both sides. The canal and foramina are patent L5-S1:Disc narrowing and bulging with biforaminal protrusion larger on the left. Degenerative facet spurring on both sides. Left foraminal impingement. IMPRESSION: 1. Multilevel disc and facet degeneration as described. On the symptomatic left side there is foraminal impingement at the L5-S1 foramen due to herniation and facet spurring. 2. Diffusely patent spinal canal. Electronically Signed   By: Tiburcio Pea M.D.   On: 03/28/2023 04:27     MRI with findings of foraminal impingement of the left L5/S1 foramen, patent spinal cord.  He remains without focal deficits here in the ED.  He is feeling better after Dilaudid but pain is starting to come back.  Will give repeat dose.  Recommended that he continue his Medrol Dosepak.  He has not had any relief from the hydrocodone so we will switch to Percocet.  Strict instructions to take 1 or the other but not both at the same time.  Wife at bedside voiced understanding as well.  Will refer to neurosurgery for follow-up.  Can return here for any new/acute changes.   Garlon Hatchet, PA-C 03/28/23 1610    Shon Baton, MD 03/28/23 [phone]

## 2023-03-30 ENCOUNTER — Telehealth: Payer: Self-pay

## 2023-03-30 NOTE — Transitions of Care (Post Inpatient/ED Visit) (Signed)
   03/30/2023  Name: Gregory Dorsey MRN: [id number] DOB: Feb 17, 1966  Today's TOC FU Call Status: Today's TOC FU Call Status:: Unsuccessul Call (1st Attempt) Unsuccessful Call (1st Attempt) Date: 03/30/23  Attempted to reach the patient regarding the most recent Inpatient/ED visit.  Follow Up Plan: Additional outreach attempts will be made to reach the patient to complete the Transitions of Care (Post Inpatient/ED visit) call.   Signature Donnamarie Poag, CMA

## 2023-03-31 ENCOUNTER — Encounter: Payer: Self-pay | Admitting: Orthopedic Surgery

## 2023-03-31 ENCOUNTER — Other Ambulatory Visit (INDEPENDENT_AMBULATORY_CARE_PROVIDER_SITE_OTHER): Payer: Managed Care, Other (non HMO)

## 2023-03-31 ENCOUNTER — Ambulatory Visit (INDEPENDENT_AMBULATORY_CARE_PROVIDER_SITE_OTHER): Payer: Managed Care, Other (non HMO) | Admitting: Orthopedic Surgery

## 2023-03-31 ENCOUNTER — Telehealth: Payer: Self-pay | Admitting: Physician Assistant

## 2023-03-31 VITALS — BP 152/82 | HR 105 | Ht 71.25 in | Wt 212.0 lb

## 2023-03-31 DIAGNOSIS — M545 Low back pain, unspecified: Secondary | ICD-10-CM

## 2023-03-31 DIAGNOSIS — G8929 Other chronic pain: Secondary | ICD-10-CM

## 2023-03-31 MED ORDER — HYDROCODONE-ACETAMINOPHEN 10-325 MG PO TABS: 1.0000 | ORAL_TABLET | ORAL | 0 refills | Status: AC | PRN

## 2023-03-31 MED ORDER — GABAPENTIN 300 MG PO CAPS: 300.0000 mg | ORAL_CAPSULE | Freq: Three times a day (TID) | ORAL | 0 refills | Status: DC

## 2023-03-31 NOTE — Telephone Encounter (Signed)
Patient is coming today

## 2023-03-31 NOTE — Telephone Encounter (Signed)
Patient wife called back in today about pain medication being sent in to [facility] on file please advise

## 2023-03-31 NOTE — Transitions of Care (Post Inpatient/ED Visit) (Signed)
I spoke with pt; pt is having extreme pain in lower back and lt leg; pain med helps somewhat. pt said when hurts really bad pt crawls from room to room. Pt already has appt with [facility] on 04/01/23 at 9 AM. UC & ED precautions given and pt voiced understanding. Sending note to DR [facility].   03/31/2023  Name: Gregory Dorsey MRN: [id number] DOB: 26-May-1966  Today's TOC FU Call Status: Today's TOC FU Call Status:: Successful TOC FU Call Competed Unsuccessful Call (1st Attempt) Date: 03/30/23 [facility] FU Call Complete Date: 03/31/23  Transition Care Management Follow-up Telephone Call Date of Discharge: 03/28/23 Discharge Facility: [facility]) Type of Discharge: Emergency Department Reason for ED Visit: Other: (lt back pain with sciatica) How have you been since you were released from the hospital?: Worse (pt is having extreme pain in lower back and lt leg; pain med helps somewhat. pt said when hurts really bad pt crawls from room to room.) Any questions or concerns?: No  Items Reviewed: Did you receive and understand the discharge instructions provided?: Yes Medications obtained,verified, and reconciled?: Yes (Medications Reviewed) (pt taking gabapentin 10 mg tid and oxycodone apap 5 - 325 mg. no questions per pt.) Any new allergies since your discharge?: No Dietary orders reviewed?: NA Do you have support at home?: Yes People in Home: spouse Name of Support/Comfort Primary Source: Lauren  Medications Reviewed Today: Medications Reviewed Today     Reviewed by Wendi Maya, RT (Technologist) on 03/25/23 at 1403  Med List Status: <None>   Medication Order Taking? Sig Documenting Provider Last Dose Status Informant  methocarbamol (ROBAXIN) 500 MG tablet [id number]  Take 1 tablet (500 mg total) by mouth every 8 (eight) hours as needed for muscle spasms.  Patient not taking: Reported on 12/18/2022   Julieanne Cotton, PA-C  Active   ondansetron [facility]) 4 MG tablet [id number]   Take 1 tablet (4 mg total) by mouth every 8 (eight) hours as needed for nausea or vomiting.  Patient not taking: Reported on 12/18/2022   Cristie Hem, PA-C  Active   oxyCODONE-acetaminophen (PERCOCET) 5-325 MG tablet [id number]  Take 1 tablet by mouth every 6 (six) hours as needed. To be taken after surgery  Patient not taking: Reported on 12/18/2022   Ivin Poot  Active             Home Care and Equipment/Supplies: Were Home Health Services Ordered?: NA Any new equipment or medical supplies ordered?: NA  Functional Questionnaire: Do you need assistance with bathing/showering or dressing?: No Do you need assistance with meal preparation?: No Do you need assistance with eating?: No Do you have difficulty maintaining continence: No Do you need assistance with getting out of bed/getting out of a chair/moving?: No Do you have difficulty managing or taking your medications?: Yes (wife is helping pt with medications)  Follow up appointments reviewed: PCP Follow-up appointment confirmed?: NA Specialist Hospital Follow-up appointment confirmed?: Yes Date of Specialist follow-up appointment?: 04/01/23 Follow-Up Specialty Provider:: [facility] Do you need transportation to your follow-up appointment?: No Do you understand care options if your condition(s) worsen?: Yes-patient verbalized understanding    SIGNATURE  Current Outpatient Medications on File Prior to Visit  Medication Sig Dispense Refill   gabapentin (NEURONTIN) 100 MG capsule Take 1 capsule (100 mg total) by mouth 3 (three) times daily. 21 capsule 0   HYDROcodone-acetaminophen (NORCO/VICODIN) 5-325 MG tablet Take 1 tablet by mouth every 4 (four) hours as needed  for up to 5 days for moderate pain. 30 tablet 0   methocarbamol (ROBAXIN) 500 MG tablet Take 1 tablet (500 mg total) by mouth every 8 (eight) hours as needed for muscle spasms. 30 tablet 1   methylPREDNISolone (MEDROL DOSEPAK) 4 MG TBPK  tablet Take as directed with food 21 tablet 0   ondansetron (ZOFRAN) 4 MG tablet Take 1 tablet (4 mg total) by mouth every 8 (eight) hours as needed for nausea or vomiting. (Patient not taking: Reported on 12/18/2022) 40 tablet 0   oxyCODONE-acetaminophen (PERCOCET) 5-325 MG tablet Take 1 tablet by mouth every 4 (four) hours as needed. 20 tablet 0   traMADol (ULTRAM) 50 MG tablet Take 1 tablet (50 mg total) by mouth every 6 (six) hours as needed. 30 tablet 0   No current facility-administered medications on file prior to visit.   Lewanda Rife, LPN

## 2023-03-31 NOTE — Progress Notes (Signed)
Orthopedic Spine Surgery Office Note  Assessment: Patient is a 57 y.o. male with low back pain that radiates into the posterior aspect of the left leg. Has left sided foraminal disc herniation at L5/S1 causing radiculopathy    Plan: -Explained that initially conservative treatment is tried as a significant number of patients may experience relief with these treatment modalities. Discussed that the conservative treatments include:  -activity modification  -physical therapy  -over the counter pain medications  -medrol dosepak  -lumbar steroid injections -Patient has tried gabapentin, tylenol, tramadol, medrol dosepak, norco, percocet  -Recommended left L5/S1 transforaminal injection -Prescribed gabapentin. Can continue with tylenol and should add an NSAID -Patient should return to office in 5 weeks, x-rays at next visit: none   Patient expressed understanding of the plan and all questions were answered to the patient's satisfaction.   ___________________________________________________________________________   History:  Patient is a 57 y.o. male who presents today for lumbar spine. Patient has had a little over one week of pain that starts in his low back and radiates into his left lower extremity. He does have a history of chronic low back pain but this pain is different and is not tolerable. It starts in his low back and goes into his buttock, left posterior thigh, and right leg. He does not have any left-sided symptoms. He has decreased sensation in his leg. He feels this in multiple distributions. No numbness in the right leg. Denies paresthesias. There was no trauma or injury that preceded the onset of pain.   Weakness: denies Symptoms of imbalance: denies Paresthesias and numbness: denies Bowel or bladder incontinence: denies Saddle anesthesia: denies  Treatments tried: gabapentin, tylenol, tramadol, medrol dosepak, norco, percocet  Review of systems: Denies fevers and  chills, night sweats, unexplained weight loss, history of cancer, pain that wakes them at night  Past medical history: GERD  Allergies: NKDA  Past surgical history:  Bilateral arthroscopic shoulder surgery  Social history: Denies use of nicotine product (smoking, vaping, patches, smokeless) Alcohol use: denies Denies recreational drug use   Physical Exam:  BMI of 29.4  General: no acute distress, appears stated age Neurologic: alert, answering questions appropriately, following commands Respiratory: unlabored breathing on room air, symmetric chest rise Psychiatric: appropriate affect, normal cadence to speech   MSK (spine):  -Strength exam      Left  Right EHL    5/5  5/5 TA    5/5  5/5 GSC    5/5  5/5 Knee extension  5/5  5/5 Hip flexion   5/5  5/5  -Sensory exam    Sensation intact to light touch in L3-S1 nerve distributions of bilateral lower extremities  -Achilles DTR: 2/4 on the left, 2/4 on the right -Patellar tendon DTR: 2/4 on the left, 2/4 on the right  -Straight leg raise: positive on the left, negative on the right -Femoral nerve stretch test: negative bilaterally -Clonus: no beats bilaterally  -Left hip exam: no pain through range of motion, negative stinchfield, negative FABER -Right hip exam: no pain through range of motion, negative stinchfield, negative FABER  Imaging: XR of the lumbar spine from 03/25/2023 and 03/31/2023 was independently reviewed and interpreted, showing disc height loss at L5/S1. No fracture or dislocation seen. No evidence of instability on flexion/extension views.   MRI of the lumbar spine from 03/28/2023 was independently reviewed and interpreted, showing DDD at L5/S1. There is a large L5/S1 left sided foraminal disc herniation. There is foraminal stenosis at the left L5/S1 foramen. No  other significant stenosis is seen.    Patient name: Gregory Dorsey Patient MRN: [id number] Date of visit: 03/31/23

## 2023-03-31 NOTE — Telephone Encounter (Signed)
Patient advising he wants to get a refill on his pain medication and want to speak to someone., patient had to increase medication dose per Dr. Steward Drone and is now out of medication and has a follow up appointment with Dr. Christell Constant tomorrow. Please call.[phone] Lorine his wife

## 2023-04-01 ENCOUNTER — Ambulatory Visit: Payer: Managed Care, Other (non HMO) | Admitting: Orthopedic Surgery

## 2023-04-06 ENCOUNTER — Telehealth: Payer: Self-pay | Admitting: Orthopedic Surgery

## 2023-04-06 MED ORDER — HYDROCODONE-ACETAMINOPHEN 10-325 MG PO TABS: 1.0000 | ORAL_TABLET | ORAL | 0 refills | Status: AC | PRN

## 2023-04-06 NOTE — Telephone Encounter (Signed)
Pt's wife called requesting refill of hydrocodone. Please send to pharmacy on file. Pt phone number is [phone].

## 2023-04-09 ENCOUNTER — Telehealth: Payer: Self-pay | Admitting: Physical Medicine and Rehabilitation

## 2023-04-09 NOTE — Telephone Encounter (Signed)
Patient's wife called asked for a call back to schedule an appointment with Dr. Alvester Morin.

## 2023-04-12 NOTE — Telephone Encounter (Signed)
Spoke with patient's wife and scheduled injection for 04/19/23. Patient aware driver needed

## 2023-04-16 ENCOUNTER — Other Ambulatory Visit: Payer: Self-pay | Admitting: Orthopedic Surgery

## 2023-04-16 ENCOUNTER — Telehealth: Payer: Self-pay | Admitting: Orthopedic Surgery

## 2023-04-16 MED ORDER — HYDROCODONE-ACETAMINOPHEN 10-325 MG PO TABS: 1.0000 | ORAL_TABLET | ORAL | 0 refills | Status: AC | PRN

## 2023-04-16 NOTE — Telephone Encounter (Signed)
Patient's wife Alyssa Grove called advised patient need Rx refilled Hydrocodone. The number to contact patient is [phone]

## 2023-04-16 NOTE — Telephone Encounter (Signed)
I called and advised rx was sent in 

## 2023-04-19 ENCOUNTER — Ambulatory Visit: Payer: Managed Care, Other (non HMO) | Admitting: Physical Medicine and Rehabilitation

## 2023-04-19 ENCOUNTER — Other Ambulatory Visit: Payer: Self-pay

## 2023-04-19 VITALS — BP 170/74 | HR 93

## 2023-04-19 DIAGNOSIS — M5416 Radiculopathy, lumbar region: Secondary | ICD-10-CM

## 2023-04-19 MED ORDER — METHYLPREDNISOLONE ACETATE 80 MG/ML IJ SUSP
80.0000 mg | Freq: Once | INTRAMUSCULAR | Status: AC
Start: 2023-04-19 — End: 2023-04-19
  Administered 2023-04-19: 80 mg

## 2023-04-19 NOTE — Patient Instructions (Signed)

## 2023-04-19 NOTE — Progress Notes (Signed)
WINFRED [place]EN[place]G - 57 y.o. male MRN [id number]  Date of birth: 1966-10-08  Office Visit Note: Visit Date: 04/19/2023 PCP: Excell Seltzer, MD Referred by: London Sheer, MD  Subjective: Chief Complaint  Patient presents with   Lower Back - Pain   HPI:  HURCHEL WALKES is a 57 y.o. male who comes in today at the request of Dr. Willia Craze for planned Left L4-5 Lumbar Transforaminal epidural steroid injection with fluoroscopic guidance.  The patient has failed conservative care including home exercise, medications, time and activity modification.  This injection will be diagnostic and hopefully therapeutic.  Please see requesting physician notes for further details and justification.   ROS Otherwise per HPI.  Assessment & Plan: Visit Diagnoses:    ICD-10-CM   1. Lumbar radiculopathy  M54.16 XR C-ARM NO REPORT    Epidural Steroid injection    methylPREDNISolone acetate (DEPO-MEDROL) injection 80 mg      Plan: No additional findings.   Meds & Orders:  Meds ordered this encounter  Medications   methylPREDNISolone acetate (DEPO-MEDROL) injection 80 mg    Orders Placed This Encounter  Procedures   XR C-ARM NO REPORT   Epidural Steroid injection    Follow-up: Return for visit to requesting provider as needed.   Procedures: No procedures performed  Lumbosacral Transforaminal Epidural Steroid Injection - Sub-Pedicular Approach with Fluoroscopic Guidance  Patient: CHAYTON ACERO      Date of Birth: 04-26-1966 MRN: [id number] PCP: Excell Seltzer, MD      Visit Date: 04/19/2023   Universal Protocol:    Date/Time: 04/19/2023  Consent Given By: the patient  Position: PRONE  Additional Comments: Vital signs were monitored before and after the procedure. Patient was prepped and draped in the usual sterile fashion. The correct patient, procedure, and site was verified.   Injection Procedure Details:   Procedure diagnoses: Lumbar radiculopathy [M54.16]    Meds  Administered:  Meds ordered this encounter  Medications   methylPREDNISolone acetate (DEPO-MEDROL) injection 80 mg    Laterality: Left  Location/Site: L5  Needle:5.0 in., 22 ga.  Short bevel or Quincke spinal needle  Needle Placement: Transforaminal  Findings:    -Comments: Excellent flow of contrast along the nerve, nerve root and into the epidural space.  Procedure Details: After squaring off the end-plates to get a true AP view, the C-arm was positioned so that an oblique view of the foramen as noted above was visualized. The target area is just inferior to the "nose of the scotty dog" or sub pedicular. The soft tissues overlying this structure were infiltrated with 2-3 ml. of 1% Lidocaine without Epinephrine.  The spinal needle was inserted toward the target using a "trajectory" view along the fluoroscope beam.  Under AP and lateral visualization, the needle was advanced so it did not puncture dura and was located close the 6 O'Clock position of the pedical in AP tracterory. Biplanar projections were used to confirm position. Aspiration was confirmed to be negative for CSF and/or blood. A 1-2 ml. volume of Isovue-250 was injected and flow of contrast was noted at each level. Radiographs were obtained for documentation purposes.   After attaining the desired flow of contrast documented above, a 0.5 to 1.0 ml test dose of 0.25% Marcaine was injected into each respective transforaminal space.  The patient was observed for 90 seconds post injection.  After no sensory deficits were reported, and normal lower extremity motor function was noted,   the above injectate  was administered so that equal amounts of the injectate were placed at each foramen (level) into the transforaminal epidural space.   Additional Comments:  No complications occurred Dressing: 2 x 2 sterile gauze and Band-Aid    Post-procedure details: Patient was observed during the procedure. Post-procedure instructions  were reviewed.  Patient left the clinic in stable condition.    Clinical History: MRI LUMBAR SPINE WITHOUT CONTRAST   TECHNIQUE: Multiplanar, multisequence MR imaging of the lumbar spine was performed. No intravenous contrast was administered.   COMPARISON:  None Available.   FINDINGS: Intermittent mild motion artifact.   Segmentation: Standard.   Alignment:  Physiologic.   Vertebrae:  No fracture, evidence of discitis, or bone lesion.   Conus medullaris and cauda equina: Conus extends to the L1-2 level. Conus and cauda equina appear normal.   Paraspinal and other soft tissues: Negative for perispinal mass or inflammation   Disc levels:   T12- L1: Mild spondylosis   L1-L2: Mild facet spurring   L2-L3: Mild disc bulging and facet spurring. Small bilateral inferior foraminal protrusion without impingement   L3-L4: Disc narrowing and bulging with biforaminal protrusion. Mild facet spurring. The canal and foramina are patent   L4-L5: Disc narrowing and bulging with left foraminal protrusion and annular fissure. Degenerative facet spurring on both sides. The canal and foramina are patent   L5-S1:Disc narrowing and bulging with biforaminal protrusion larger on the left. Degenerative facet spurring on both sides. Left foraminal impingement.   IMPRESSION: 1. Multilevel disc and facet degeneration as described. On the symptomatic left side there is foraminal impingement at the L5-S1 foramen due to herniation and facet spurring. 2. Diffusely patent spinal canal.     Electronically Signed   By: Tiburcio Pea M.D.   On: 03/28/2023 04:27     Objective:  VS:  HT:    WT:   BMI:     BP:(!) 170/74  HR:93bpm  TEMP: ( )  RESP:  Physical Exam Vitals and nursing note reviewed.  Constitutional:      General: He is not in acute distress.    Appearance: Normal appearance. He is not ill-appearing.  HENT:     Head: Normocephalic and atraumatic.     Right Ear:  External ear normal.     Left Ear: External ear normal.     Nose: No congestion.  Eyes:     Extraocular Movements: Extraocular movements intact.  Cardiovascular:     Rate and Rhythm: Normal rate.     Pulses: Normal pulses.  Pulmonary:     Effort: Pulmonary effort is normal. No respiratory distress.  Abdominal:     General: There is no distension.     Palpations: Abdomen is soft.  Musculoskeletal:        General: No tenderness or signs of injury.     Cervical back: Neck supple.     Right lower leg: No edema.     Left lower leg: No edema.     Comments: Patient has good distal strength without clonus.  Skin:    Findings: No erythema or rash.  Neurological:     General: No focal deficit present.     Mental Status: He is alert and oriented to person, place, and time.     Sensory: No sensory deficit.     Motor: No weakness or abnormal muscle tone.     Coordination: Coordination normal.  Psychiatric:        Mood and Affect: Mood normal.  Behavior: Behavior normal.      Imaging: No results found.

## 2023-04-19 NOTE — Procedures (Signed)
Lumbosacral Transforaminal Epidural Steroid Injection - Sub-Pedicular Approach with Fluoroscopic Guidance  Patient: Gregory Dorsey      Date of Birth: 1966-07-02 MRN: [id number] PCP: Excell Seltzer, MD      Visit Date: 04/19/2023   Universal Protocol:    Date/Time: 04/19/2023  Consent Given By: the patient  Position: PRONE  Additional Comments: Vital signs were monitored before and after the procedure. Patient was prepped and draped in the usual sterile fashion. The correct patient, procedure, and site was verified.   Injection Procedure Details:   Procedure diagnoses: Lumbar radiculopathy [M54.16]    Meds Administered:  Meds ordered this encounter  Medications   methylPREDNISolone acetate (DEPO-MEDROL) injection 80 mg    Laterality: Left  Location/Site: L5  Needle:5.0 in., 22 ga.  Short bevel or Quincke spinal needle  Needle Placement: Transforaminal  Findings:    -Comments: Excellent flow of contrast along the nerve, nerve root and into the epidural space.  Procedure Details: After squaring off the end-plates to get a true AP view, the C-arm was positioned so that an oblique view of the foramen as noted above was visualized. The target area is just inferior to the "nose of the scotty dog" or sub pedicular. The soft tissues overlying this structure were infiltrated with 2-3 ml. of 1% Lidocaine without Epinephrine.  The spinal needle was inserted toward the target using a "trajectory" view along the fluoroscope beam.  Under AP and lateral visualization, the needle was advanced so it did not puncture dura and was located close the 6 O'Clock position of the pedical in AP tracterory. Biplanar projections were used to confirm position. Aspiration was confirmed to be negative for CSF and/or blood. A 1-2 ml. volume of Isovue-250 was injected and flow of contrast was noted at each level. Radiographs were obtained for documentation purposes.   After attaining the desired flow of  contrast documented above, a 0.5 to 1.0 ml test dose of 0.25% Marcaine was injected into each respective transforaminal space.  The patient was observed for 90 seconds post injection.  After no sensory deficits were reported, and normal lower extremity motor function was noted,   the above injectate was administered so that equal amounts of the injectate were placed at each foramen (level) into the transforaminal epidural space.   Additional Comments:  No complications occurred Dressing: 2 x 2 sterile gauze and Band-Aid    Post-procedure details: Patient was observed during the procedure. Post-procedure instructions were reviewed.  Patient left the clinic in stable condition.

## 2023-04-19 NOTE — Progress Notes (Signed)
Functional Pain Scale - descriptive words and definitions  Distressing (6)    Pain is present/unable to complete most ADLs limited by pain/sleep is difficult and active distraction is only marginal. Moderate range order  Average Pain  varies   +Driver, -BT, -Dye Allergies.  Lower back pain on left side that radiates down the left leg to the foot

## 2023-05-05 ENCOUNTER — Ambulatory Visit: Payer: Managed Care, Other (non HMO) | Admitting: Orthopedic Surgery

## 2023-05-05 DIAGNOSIS — M5416 Radiculopathy, lumbar region: Secondary | ICD-10-CM | POA: Diagnosis not present

## 2023-05-05 IMAGING — CT CT RENAL STONE PROTOCOL
2 of 4 series · 16 of 46 positions shown, 18 images · non-contrast
Comparison: 04/12/2013

CLINICAL DATA: Right flank pain, dysuria

EXAM:
CT ABDOMEN AND PELVIS WITHOUT CONTRAST
TECHNIQUE: Multidetector CT imaging of the abdomen and pelvis was performed
following the standard protocol without IV contrast.

[Series 2: axial st · axial · 0.92mm/px · z∈[-577,-157]mm · 13 of 99 slices shown, 15 images]
[im 8/99  soft-tissue]
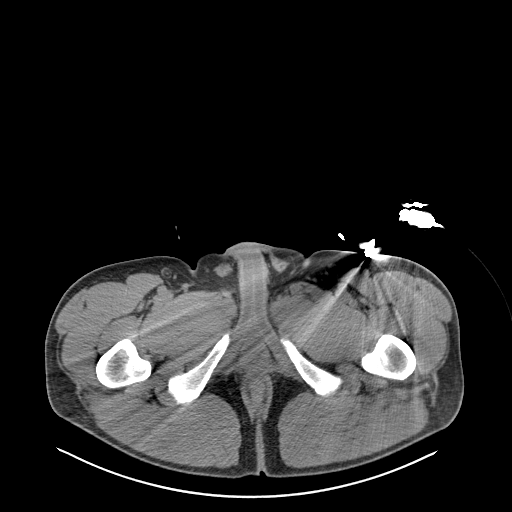
[im 8/99  bone]
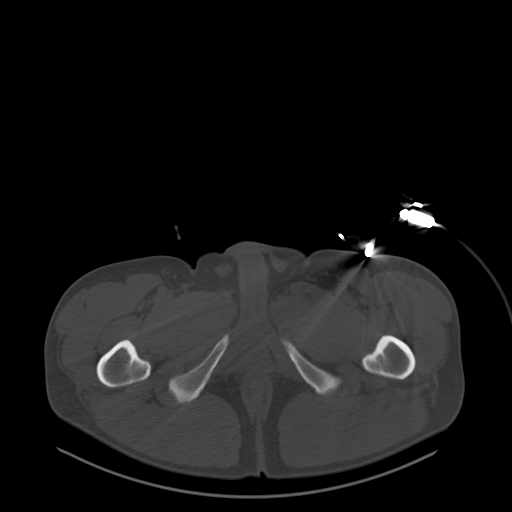
[im 15/99  soft-tissue]
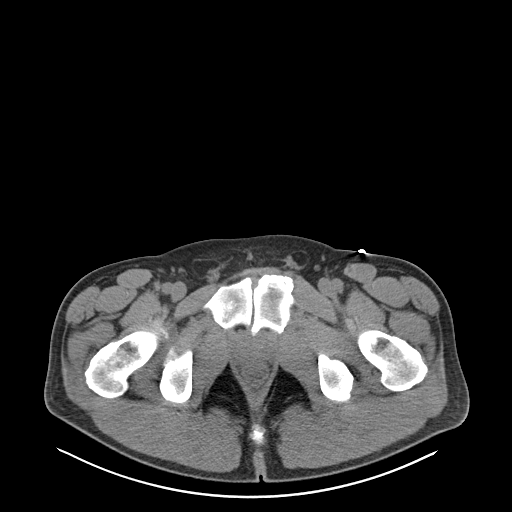
[im 22/99  soft-tissue]
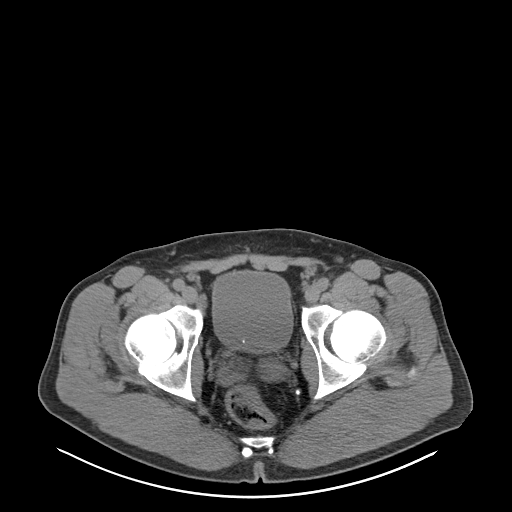
[im 29/99  soft-tissue]
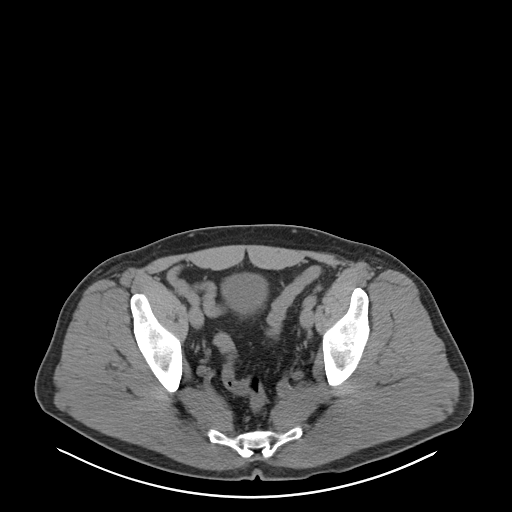
[im 36/99  soft-tissue]
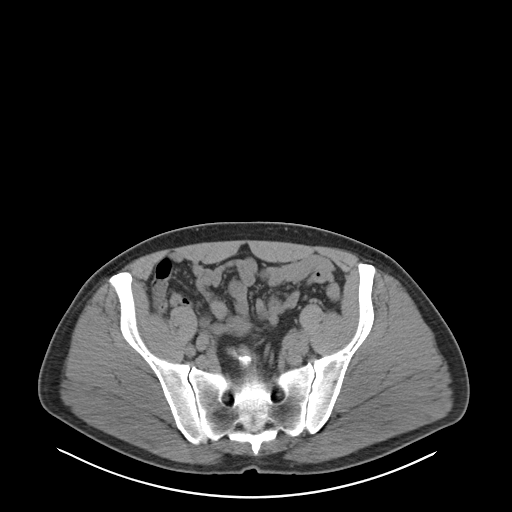
[im 43/99  soft-tissue]
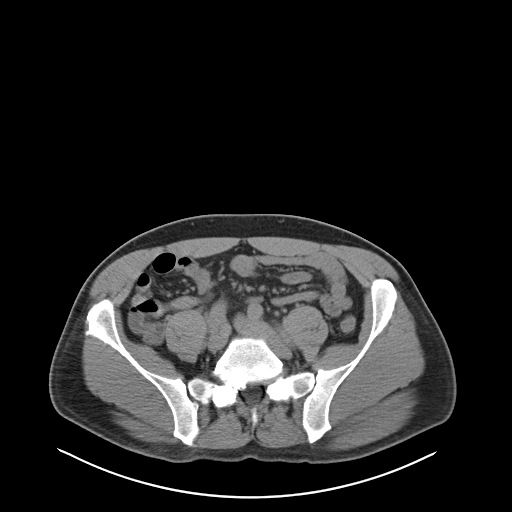
[im 50/99  soft-tissue]
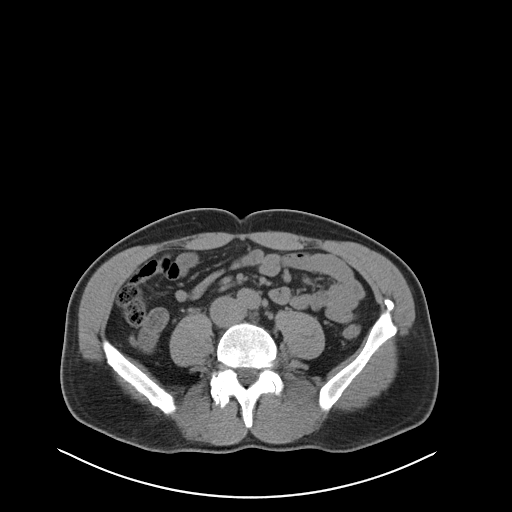
[im 57/99  soft-tissue]
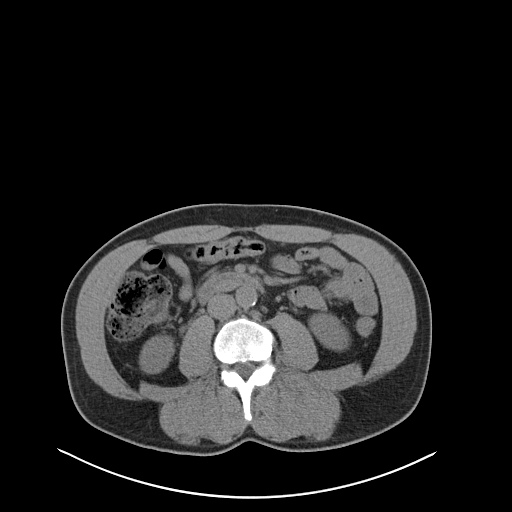
[im 64/99  soft-tissue]
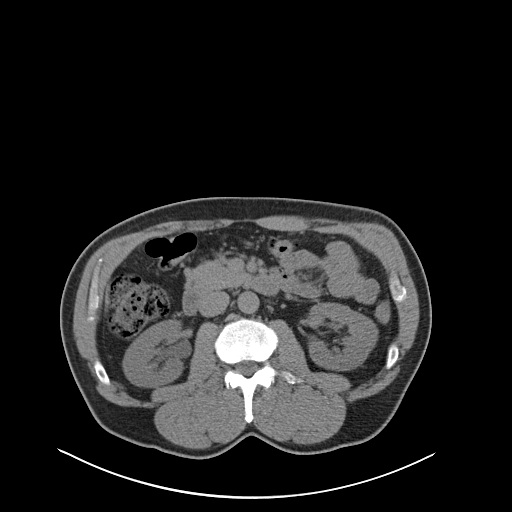
[im 64/99  bone]
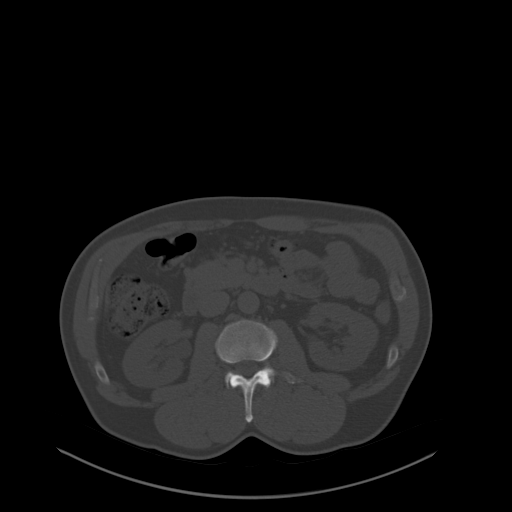
[im 71/99  soft-tissue]
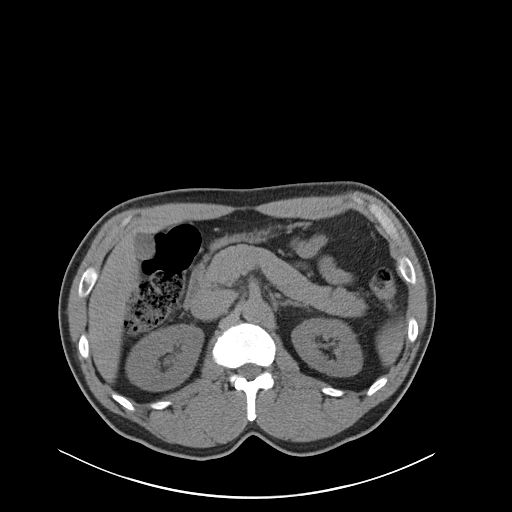
[im 78/99  soft-tissue]
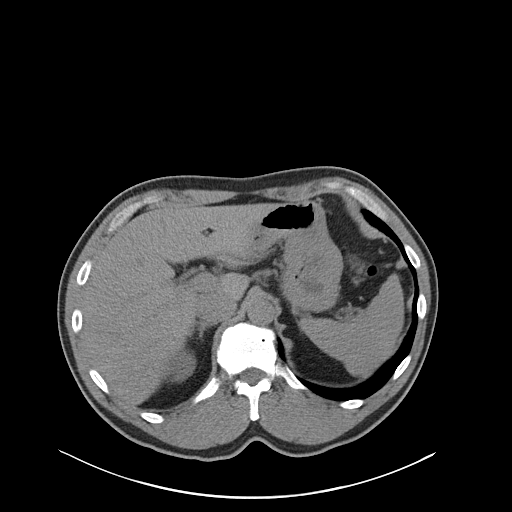
[im 85/99  soft-tissue]
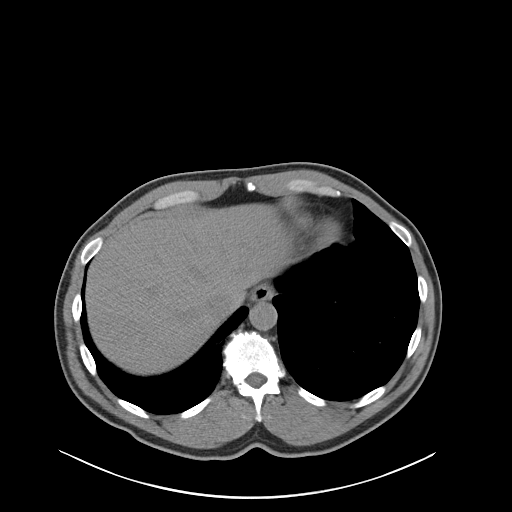
[im 92/99  soft-tissue]
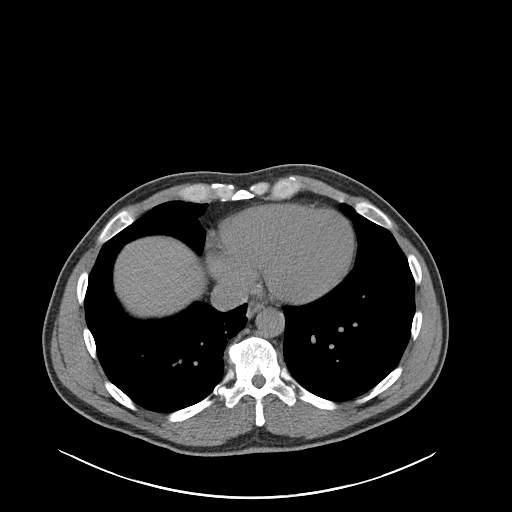

[Series 4: coronal · coronal · 0.79mm/px · 3 of 151 slices shown]
[im 51/151  soft-tissue]
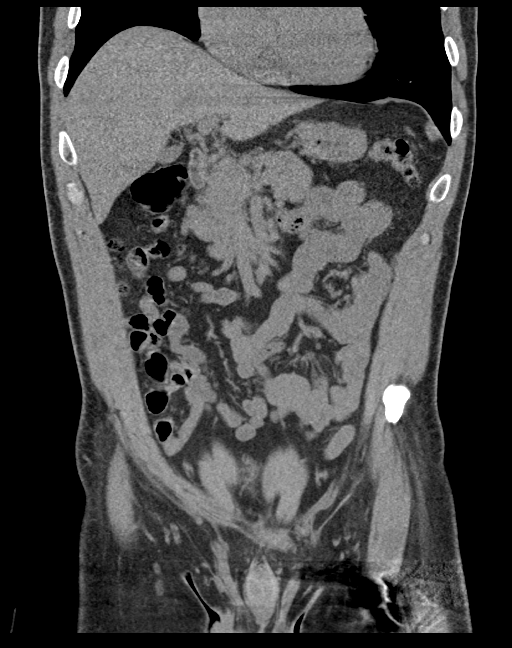
[im 67/151  soft-tissue]
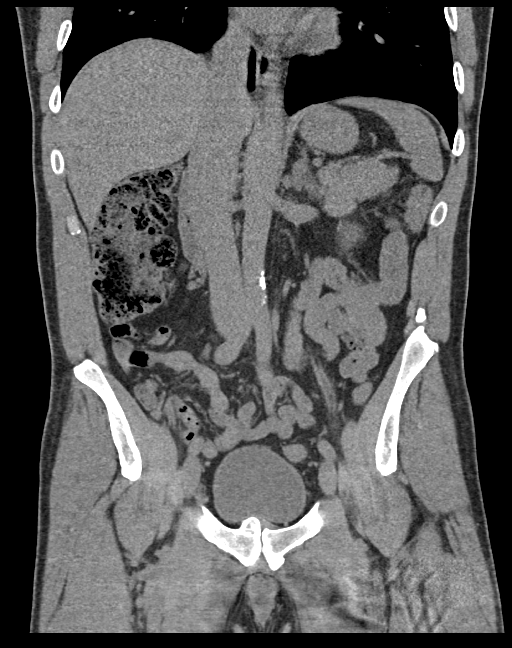
[im 84/151  soft-tissue]
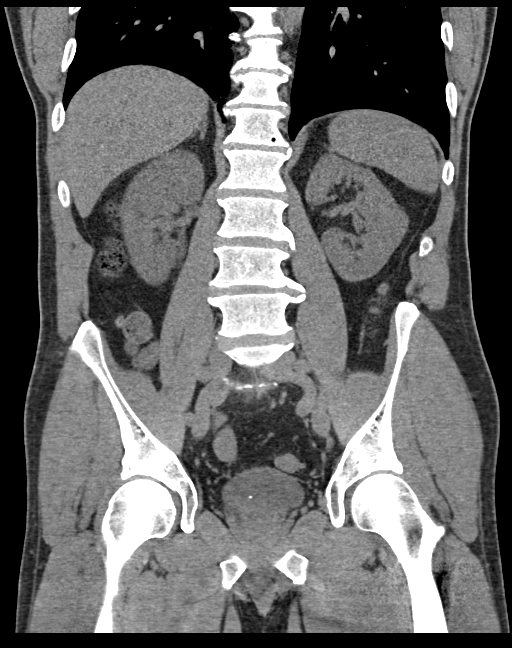

[16 of 46 positions shown; findings below may reference images not displayed]

FINDINGS: Lower chest: Lung bases are clear.

Hepatobiliary: Unenhanced liver is unremarkable.

Gallbladder is unremarkable. No intrahepatic or extrahepatic ductal
dilatation.

Pancreas: Within normal limits.

Spleen: Within normal limits.

Adrenals/Urinary Tract: Adrenal glands are within normal limits.

2 mm nonobstructing left upper pole renal calculus (coronal image
81). Right kidney is within normal limits. Mild right
hydroureteronephrosis. 2 mm distal right ureteral calculus at the
UVJ (series 2/image 78).

Bladder is within normal limits.

Stomach/Bowel: Stomach is within normal limits.

No evidence of bowel obstruction.

Normal appendix (series 2/image 48).

Vascular/Lymphatic: No evidence of abdominal aortic aneurysm.

Atherosclerotic calcifications of the abdominal aorta and branch
vessels.

No suspicious abdominopelvic lymphadenopathy.

Reproductive: Prostate is unremarkable.

Other: No abdominopelvic ascites.

Musculoskeletal: Mild degenerative changes of the visualized
thoracolumbar spine.
IMPRESSION: 2 mm distal right ureteral calculus at the UVJ. Mild right
hydroureteronephrosis.

Additional 2 mm nonobstructing left upper pole renal calculus.

## 2023-05-05 NOTE — Progress Notes (Signed)
Orthopedic Spine Surgery Office Note  Assessment: Patient is a 57 y.o. male with lumbar radiculopathy that has resolved with an injection   Plan: -Explained that initially conservative treatment is tried as a significant number of patients may experience relief with these treatment modalities. Discussed that the conservative treatments include:  -activity modification  -physical therapy  -over the counter pain medications  -medrol dosepak  -lumbar steroid injections -Patient has tried gabapentin, tylenol, tramadol, medrol dosepak, norco, percocet, lumbar steroid injection -Recommended no further intervention at this time since his pain has resolved -Patient should return to office on an as needed basis   Patient expressed understanding of the plan and all questions were answered to the patient's satisfaction.   ___________________________________________________________________________  History: Patient is a 57 y.o. male who has been previously seen in the office for symptoms consistent with lumbar radiculopathy. After our last visit, he got an injection that helped significantly. He states he got 100% relief and his not having any more pain radiating into his left leg. He has noticed some decreased sensation in his left leg but hat has improved as well. He no longer has any thigh numbness but still has some numbness over the anterior leg. 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   Previous treatments: gabapentin, tylenol, tramadol, medrol dosepak, norco, percocet, lumbar steroid injection  Physical Exam:  General: no acute distress, appears stated age Neurologic: alert, answering questions appropriately, following commands Respiratory: unlabored breathing on room air, symmetric chest rise Psychiatric: appropriate affect, normal cadence to speech   MSK (spine):  -Strength exam      Left  Right EHL    5/5  5/5 TA    5/5  5/5 GSC    5/5  5/5 Knee extension  5/5  5/5 Hip flexion    5/5  5/5  -Sensory exam    Sensation intact to light touch in L3-S1 nerve distributions of bilateral lower extremities  -Achilles DTR: 2/4 on the left, 2/4 on the right -Patellar tendon DTR: 2/4 on the left, 2/4 on the right  -Straight leg raise: negative bilaterally  -Femoral nerve stretch test: negative bilaterally  -Clonus: no beats bilaterally  Imaging: XR of the lumbar spine from 03/25/2023 and 03/31/2023 was previously independently reviewed and interpreted, showing disc height loss at L5/S1. No fracture or dislocation seen. No evidence of instability on flexion/extension views.    MRI of the lumbar spine from 03/28/2023 was previously independently reviewed and interpreted, showing DDD at L5/S1. There is a large L5/S1 left sided foraminal disc herniation. There is foraminal stenosis at the left L5/S1 foramen. No other significant stenosis is seen.    Patient name: Gregory Dorsey Patient MRN: [id number] Date of visit: 05/05/23

## 2023-07-29 ENCOUNTER — Telehealth: Payer: Self-pay | Admitting: Family Medicine

## 2023-07-29 DIAGNOSIS — Z125 Encounter for screening for malignant neoplasm of prostate: Secondary | ICD-10-CM

## 2023-07-29 DIAGNOSIS — R7303 Prediabetes: Secondary | ICD-10-CM

## 2023-07-29 DIAGNOSIS — E7800 Pure hypercholesterolemia, unspecified: Secondary | ICD-10-CM

## 2023-07-29 NOTE — Telephone Encounter (Signed)
-----   Message from Eustaquio Boyden sent at 07/29/2023 12:59 PM EDT ----- Regarding: FW: Lab orders for Cyndia Bent, 10.2.24 Route to PCP ----- Message ----- From: Alvina Chou Sent: 07/20/2023   3:06 PM EDT To: Eustaquio Boyden, MD Subject: Lab orders for Tu, 10.2.24                     Patient is scheduled for CPX labs, please order future labs, Thanks , Camelia Eng

## 2023-08-04 ENCOUNTER — Other Ambulatory Visit (INDEPENDENT_AMBULATORY_CARE_PROVIDER_SITE_OTHER): Payer: Managed Care, Other (non HMO)

## 2023-08-04 DIAGNOSIS — E7800 Pure hypercholesterolemia, unspecified: Secondary | ICD-10-CM | POA: Diagnosis not present

## 2023-08-04 DIAGNOSIS — R7303 Prediabetes: Secondary | ICD-10-CM | POA: Diagnosis not present

## 2023-08-04 DIAGNOSIS — Z125 Encounter for screening for malignant neoplasm of prostate: Secondary | ICD-10-CM | POA: Diagnosis not present

## 2023-08-04 LAB — PSA: PSA: 1.43 ng/mL (ref 0.10–4.00)

## 2023-08-04 LAB — COMPREHENSIVE METABOLIC PANEL
ALT: 19 U/L (ref 0–53)
AST: 17 U/L (ref 0–37)
Albumin: 4.5 g/dL (ref 3.5–5.2)
Alkaline Phosphatase: 74 U/L (ref 39–117)
BUN: 15 mg/dL (ref 6–23)
CO2: 25 meq/L (ref 19–32)
Calcium: 9.3 mg/dL (ref 8.4–10.5)
Chloride: 105 meq/L (ref 96–112)
Creatinine, Ser: 0.93 mg/dL (ref 0.40–1.50)
GFR: 91.36 mL/min (ref >60.00)
Glucose, Bld: 92 mg/dL (ref 70–99)
Potassium: 4.3 meq/L (ref 3.5–5.1)
Sodium: 139 meq/L (ref 135–145)
Total Bilirubin: 1 mg/dL (ref 0.2–1.2)
Total Protein: 6.9 g/dL (ref 6.0–8.3)

## 2023-08-04 LAB — LIPID PANEL
Cholesterol: 230 mg/dL — ABNORMAL HIGH (ref 0–200)
HDL: 66.4 mg/dL (ref >39.00)
LDL Cholesterol: 144 mg/dL — ABNORMAL HIGH (ref 0–99)
NonHDL: 163.27
Total CHOL/HDL Ratio: 3
Triglycerides: 94 mg/dL (ref 0.0–149.0)
VLDL: 18.8 mg/dL (ref 0.0–40.0)

## 2023-08-04 LAB — HEMOGLOBIN A1C: Hgb A1c MFr Bld: 5.4 % (ref 4.6–6.5)

## 2023-08-04 NOTE — Progress Notes (Signed)
No critical labs need to be addressed urgently. We will discuss labs in detail at upcoming office visit.   

## 2023-08-11 ENCOUNTER — Ambulatory Visit (INDEPENDENT_AMBULATORY_CARE_PROVIDER_SITE_OTHER): Payer: Managed Care, Other (non HMO) | Admitting: Family Medicine

## 2023-08-11 ENCOUNTER — Encounter: Payer: Self-pay | Admitting: Family Medicine

## 2023-08-11 VITALS — BP 138/80 | HR 77 | Temp 97.6°F | Ht 71.25 in | Wt 214.0 lb

## 2023-08-11 DIAGNOSIS — Z0000 Encounter for general adult medical examination without abnormal findings: Secondary | ICD-10-CM

## 2023-08-11 DIAGNOSIS — E7800 Pure hypercholesterolemia, unspecified: Secondary | ICD-10-CM

## 2023-08-11 DIAGNOSIS — R7303 Prediabetes: Secondary | ICD-10-CM

## 2023-08-11 NOTE — Patient Instructions (Addendum)
Work on low cholesterol diet changes and continue  your exercise.  Here is some info I have gathered for you for a trusted medical source. Lipid Management With Diet, Uptodate Feb 20.2021, [place] and [place]  Plan on colonoscopy   after 10/2023.. call Dr Urban Gibson office.  Although earlier, smaller trials suggested a benefit of garlic supplementation, a subsequent larger trial failed to demonstrate improvement in lipids with use of any of three different garlic preparations (raw, powdered, or aged).  Bergamot: Improvements in serum lipids have been reported in trials of patients with metabolic syndrome, nonalcoholic fatty liver disease and in hyperlipidemic patients resistant to statin treatment. However, high-quality data on the effects of bergamot are lacking.  Suggestions for you if you would like to try natural supplements to lower cholesterol.  1.Souble fiber : Psyllium In a meta-analysis of randomized trials of patients with both normal and elevated cholesterol levels, the addition of 10.2 g/day of psyllium lowered the LDL cholesterol by an average of 12.8 mg/dL   2. Omega 3s: Mixed results in studies. Given you triglycerides are normal I would not use this.  3.Red yeast rice ( 2.4 grams divided half in AM half in PM): Red yeast rice is a fermented rice product, most often taken as a supplement, which can improve serum cholesterol  via  method similar to prescription statins.  Red yeast rice supplements lowered total cholesterol (208 versus 251 mg/dL) and LDL cholesterol (161 versus 175 mg/dL) compared with placebo.  4. Plant sterol.. There are naturally occurring sterols and stanols in nuts, legumes, whole grains, fruits, vegetables, and plant oils. In addition, a number of manufactured products enriched with plant sterols and stanols are commercially available. The margarines containing these compounds (eg, Benecol and Take Control spreads) have been available the longest and are the  most studied  In a trial of 150 patients with mild hypercholesterolemia,those consuming the fortified margarine experienced a 10 to 14 percent decrease in total cholesterol and LDL cholesterol.  5.Green Tea Catechins: .n a year-long randomized trial of more than 900 healthy postmenopausal women, green tea catechin supplements (1315 mg catechins/day) reduced total cholesterol and LDL cholesterol, increased triglycerides, and had no effect on HDL cholesterol

## 2023-08-11 NOTE — Assessment & Plan Note (Signed)
Chronic, improved control with regular exercise.

## 2023-08-11 NOTE — Assessment & Plan Note (Signed)
Chronic, worsened control in the last year now with 7% 10-year ASCVD risk as well as family history with heart disease in  father at an early age. We discussed consideration of cholesterol medication but he would like to try to work on diet changes as he has a lot of eggs and red meat each week. I also provided him with information on natural treatments of cholesterol including fiber and red yeast rice. He will consider returning in 3 months for cholesterol reevaluation.  If it is remaining high would strongly recommend cholesterol medication.

## 2023-08-11 NOTE — Progress Notes (Signed)
Patient ID: Gregory Dorsey, male    DOB: [birth year], 57 y.o.   MRN: [id number]  This visit was conducted in person.  BP 138/80 (BP Location: Left Arm, Patient Position: Sitting, Cuff Size: Normal)   Pulse 77   Temp 97.6 F (36.4 C) (Oral)   Ht 5' 11.25" (1.81 m)   Wt 214 lb (97.1 kg)   SpO2 98%   BMI 29.64 kg/m    CC:  Chief Complaint  Patient presents with   Annual Exam    Subjective:   HPI: Gregory Dorsey is a 57 y.o. male presenting on 08/11/2023 for Annual Exam   Recent issues with low back pain.  Seen By Dr. Christell Constant, Ortho.  Elevated Cholesterol:  worsened control of cholesterol Lab Results  Component Value Date   CHOL 230 (H) 08/04/2023   HDL 66.40 08/04/2023   LDLCALC 144 (H) 08/04/2023   LDLDIRECT 117.7 01/19/2013   TRIG 94.0 08/04/2023   CHOLHDL 3 08/04/2023  The 10-year ASCVD risk score (Arnett DK, et al., 2019) is: 7%   Values used to calculate the score:     Age: 60 years     Sex: Male     Is Non-Hispanic African American: No     Diabetic: No     Tobacco smoker: No     Systolic Blood Pressure: 138 mmHg     Is BP treated: No     HDL Cholesterol: 66.4 mg/dL     Total Cholesterol: 230 mg/dL Using medications without problems: none Muscle aches:  none Diet compliance: Exercise: walking  2 miles.. has recently restarted. Other complaints:   Wt Readings from Last 3 Encounters:  08/11/23 214 lb (97.1 kg)  03/31/23 212 lb (96.2 kg)  08/07/22 202 lb 4 oz (91.7 kg)     Prediabetes  Improved in last year. Lab Results  Component Value Date   HGBA1C 5.4 08/04/2023     Flowsheet Row Office Visit from 08/11/2023 in [facility]  PHQ-2 Total Score 0           Relevant past medical, surgical, family and social history reviewed and updated as indicated. Interim medical history since our last visit reviewed. Allergies and medications reviewed and updated. Outpatient Medications Prior to Visit  Medication Sig  Dispense Refill   gabapentin (NEURONTIN) 300 MG capsule Take 1 capsule (300 mg total) by mouth 3 (three) times daily. 90 capsule 0   methocarbamol (ROBAXIN) 500 MG tablet Take 1 tablet (500 mg total) by mouth every 8 (eight) hours as needed for muscle spasms. 30 tablet 1   methylPREDNISolone (MEDROL DOSEPAK) 4 MG TBPK tablet Take as directed with food 21 tablet 0   ondansetron (ZOFRAN) 4 MG tablet Take 1 tablet (4 mg total) by mouth every 8 (eight) hours as needed for nausea or vomiting. (Patient not taking: Reported on 12/18/2022) 40 tablet 0   No facility-administered medications prior to visit.     Per HPI unless specifically indicated in ROS section below Review of Systems  Constitutional:  Negative for fatigue and fever.  HENT:  Negative for ear pain.   Eyes:  Negative for pain.  Respiratory:  Negative for cough and shortness of breath.   Cardiovascular:  Negative for chest pain, palpitations and leg swelling.  Gastrointestinal:  Negative for abdominal pain.  Genitourinary:  Negative for dysuria.  Musculoskeletal:  Negative for arthralgias.  Neurological:  Negative for syncope, light-headedness and headaches.  Psychiatric/Behavioral:  Negative  for dysphoric mood.    Objective:  BP 138/80 (BP Location: Left Arm, Patient Position: Sitting, Cuff Size: Normal)   Pulse 77   Temp 97.6 F (36.4 C) (Oral)   Ht 5' 11.25" (1.81 m)   Wt 214 lb (97.1 kg)   SpO2 98%   BMI 29.64 kg/m   Wt Readings from Last 3 Encounters:  08/11/23 214 lb (97.1 kg)  03/31/23 212 lb (96.2 kg)  08/07/22 202 lb 4 oz (91.7 kg)      Physical Exam Constitutional:      General: He is not in acute distress.    Appearance: Normal appearance. He is well-developed. He is not ill-appearing or toxic-appearing.  HENT:     Head: Normocephalic and atraumatic.     Right Ear: Hearing, tympanic membrane, ear canal and external ear normal.     Left Ear: Hearing, tympanic membrane, ear canal and external ear normal.      Nose: Nose normal.     Mouth/Throat:     Pharynx: Uvula midline.  Eyes:     General: Lids are normal. Lids are everted, no foreign bodies appreciated.     Conjunctiva/sclera: Conjunctivae normal.     Pupils: Pupils are equal, round, and reactive to light.  Neck:     Thyroid: No thyroid mass or thyromegaly.     Vascular: No carotid bruit.     Trachea: Trachea and phonation normal.  Cardiovascular:     Rate and Rhythm: Normal rate and regular rhythm.     Pulses: Normal pulses.     Heart sounds: S1 normal and S2 normal. No murmur heard.    No gallop.  Pulmonary:     Breath sounds: Normal breath sounds. No wheezing, rhonchi or rales.  Abdominal:     General: Bowel sounds are normal.     Palpations: Abdomen is soft.     Tenderness: There is no abdominal tenderness. There is no guarding or rebound.     Hernia: No hernia is present.  Musculoskeletal:     Cervical back: Normal range of motion and neck supple.  Lymphadenopathy:     Cervical: No cervical adenopathy.  Skin:    General: Skin is warm and dry.     Findings: No rash.  Neurological:     Mental Status: He is alert.     Cranial Nerves: No cranial nerve deficit.     Sensory: No sensory deficit.     Gait: Gait normal.     Deep Tendon Reflexes: Reflexes are normal and symmetric.  Psychiatric:        Speech: Speech normal.        Behavior: Behavior normal.        Judgment: Judgment normal.       Results for orders placed or performed in visit on 08/04/23  Hemoglobin A1c  Result Value Ref Range   Hgb A1c MFr Bld 5.4 4.6 - 6.5 %  Comprehensive metabolic panel  Result Value Ref Range   Sodium 139 135 - 145 mEq/L   Potassium 4.3 3.5 - 5.1 mEq/L   Chloride 105 96 - 112 mEq/L   CO2 25 19 - 32 mEq/L   Glucose, Bld 92 70 - 99 mg/dL   BUN 15 6 - 23 mg/dL   Creatinine, Ser 1.19 0.40 - 1.50 mg/dL   Total Bilirubin 1.0 0.2 - 1.2 mg/dL   Alkaline Phosphatase 74 39 - 117 U/L   AST 17 0 - 37 U/L   ALT 19 0 -  53 U/L   Total  Protein 6.9 6.0 - 8.3 g/dL   Albumin 4.5 3.5 - 5.2 g/dL   GFR 19.14 >78.29 mL/min   Calcium 9.3 8.4 - 10.5 mg/dL  PSA  Result Value Ref Range   PSA 1.43 0.10 - 4.00 ng/mL  Lipid panel  Result Value Ref Range   Cholesterol 230 (H) 0 - 200 mg/dL   Triglycerides 56.2 0.0 - 149.0 mg/dL   HDL 13.08 >65.78 mg/dL   VLDL 46.9 0.0 - 62.9 mg/dL   LDL Cholesterol 528 (H) 0 - 99 mg/dL   Total CHOL/HDL Ratio 3    NonHDL 163.27      COVID 19 screen:  No recent travel or known exposure to COVID19 The patient denies respiratory symptoms of COVID 19 at this time. The importance of social distancing was discussed today.   Assessment and Plan   The patient's preventative maintenance and recommended screening tests for an annual wellness exam were reviewed in full today. Brought up to date unless services declined.  Counselled on the importance of diet, exercise, and its role in overall health and mortality. The patient's FH and SH was reviewed, including their home life, tobacco status, and drug and alcohol status.   Vaccines:uptodate  Tdap,   refused flu,  Discussed COVID19 vaccine side effects and benefits. Strongly encouraged the patient to get the vaccine. Questions answered. Encouraged shingrix. No family history of early prostate/colon cancer . Colon: 10/2018 colonoscopy, polyp  Dr. Chales Abrahams, repeat 5 years Not interested in STD testing  Non smoker.  No ETOH use.  Problem List Items Addressed This Visit     High cholesterol    Chronic, worsened control in the last year now with 7% 10-year ASCVD risk as well as family history with heart disease in  father at an early age. We discussed consideration of cholesterol medication but he would like to try to work on diet changes as he has a lot of eggs and red meat each week. I also provided him with information on natural treatments of cholesterol including fiber and red yeast rice. He will consider returning in 3 months for cholesterol  reevaluation.  If it is remaining high would strongly recommend cholesterol medication.        Prediabetes    Chronic, improved control with regular exercise.       Other Visit Diagnoses     Routine general medical examination at a health care facility    -  Primary         [name], MD

## 2023-12-28 ENCOUNTER — Encounter: Payer: Self-pay | Admitting: Gastroenterology

## 2024-07-27 ENCOUNTER — Telehealth: Payer: Self-pay | Admitting: *Deleted

## 2024-07-27 DIAGNOSIS — Z125 Encounter for screening for malignant neoplasm of prostate: Secondary | ICD-10-CM

## 2024-07-27 DIAGNOSIS — Z114 Encounter for screening for human immunodeficiency virus [HIV]: Secondary | ICD-10-CM

## 2024-07-27 DIAGNOSIS — E7800 Pure hypercholesterolemia, unspecified: Secondary | ICD-10-CM

## 2024-07-27 DIAGNOSIS — R7303 Prediabetes: Secondary | ICD-10-CM

## 2024-07-27 NOTE — Telephone Encounter (Signed)
-----   Message from Veva JINNY Ferrari sent at 07/27/2024  2:37 PM EDT ----- Regarding: Lab orders for Tue, 10.7.25 Patient is scheduled for CPX labs, please order future labs, Thanks , Veva

## 2024-08-08 ENCOUNTER — Other Ambulatory Visit (INDEPENDENT_AMBULATORY_CARE_PROVIDER_SITE_OTHER): Payer: Managed Care, Other (non HMO)

## 2024-08-08 ENCOUNTER — Ambulatory Visit: Payer: Self-pay | Admitting: Family Medicine

## 2024-08-08 DIAGNOSIS — Z114 Encounter for screening for human immunodeficiency virus [HIV]: Secondary | ICD-10-CM

## 2024-08-08 DIAGNOSIS — R7303 Prediabetes: Secondary | ICD-10-CM

## 2024-08-08 DIAGNOSIS — Z125 Encounter for screening for malignant neoplasm of prostate: Secondary | ICD-10-CM

## 2024-08-08 DIAGNOSIS — E7800 Pure hypercholesterolemia, unspecified: Secondary | ICD-10-CM | POA: Diagnosis not present

## 2024-08-08 LAB — LIPID PANEL
Cholesterol: 209 mg/dL — ABNORMAL HIGH (ref 0–200)
HDL: 57 mg/dL (ref >39.00)
LDL Cholesterol: 140 mg/dL — ABNORMAL HIGH (ref 0–99)
NonHDL: 152.12
Total CHOL/HDL Ratio: 4
Triglycerides: 63 mg/dL (ref 0.0–149.0)
VLDL: 12.6 mg/dL (ref 0.0–40.0)

## 2024-08-08 LAB — COMPREHENSIVE METABOLIC PANEL WITH GFR
ALT: 19 U/L (ref 0–53)
AST: 18 U/L (ref 0–37)
Albumin: 4.9 g/dL (ref 3.5–5.2)
Alkaline Phosphatase: 69 U/L (ref 39–117)
BUN: 15 mg/dL (ref 6–23)
CO2: 28 meq/L (ref 19–32)
Calcium: 9.5 mg/dL (ref 8.4–10.5)
Chloride: 104 meq/L (ref 96–112)
Creatinine, Ser: 0.9 mg/dL (ref 0.40–1.50)
GFR: 94.35 mL/min (ref >60.00)
Glucose, Bld: 97 mg/dL (ref 70–99)
Potassium: 4.4 meq/L (ref 3.5–5.1)
Sodium: 140 meq/L (ref 135–145)
Total Bilirubin: 0.8 mg/dL (ref 0.2–1.2)
Total Protein: 7.1 g/dL (ref 6.0–8.3)

## 2024-08-08 LAB — PSA: PSA: 1.24 ng/mL (ref 0.10–4.00)

## 2024-08-08 LAB — HEMOGLOBIN A1C: Hgb A1c MFr Bld: 5.9 % (ref 4.6–6.5)

## 2024-08-08 NOTE — Progress Notes (Signed)
 No critical labs need to be addressed urgently. We will discuss labs in detail at upcoming office visit.

## 2024-08-09 LAB — HIV ANTIBODY (ROUTINE TESTING W REFLEX)
HIV 1&2 Ab, 4th Generation: NONREACTIVE
HIV FINAL INTERPRETATION: NEGATIVE

## 2024-08-15 ENCOUNTER — Encounter: Payer: Managed Care, Other (non HMO) | Admitting: Family Medicine

## 2024-08-18 ENCOUNTER — Encounter: Payer: Self-pay | Admitting: Family Medicine

## 2024-08-18 ENCOUNTER — Ambulatory Visit (INDEPENDENT_AMBULATORY_CARE_PROVIDER_SITE_OTHER): Payer: Self-pay | Admitting: Family Medicine

## 2024-08-18 VITALS — BP 134/82 | HR 72 | Temp 97.7°F | Ht 71.25 in | Wt 228.0 lb

## 2024-08-18 DIAGNOSIS — E7800 Pure hypercholesterolemia, unspecified: Secondary | ICD-10-CM

## 2024-08-18 DIAGNOSIS — R7303 Prediabetes: Secondary | ICD-10-CM | POA: Diagnosis not present

## 2024-08-18 DIAGNOSIS — Z8249 Family history of ischemic heart disease and other diseases of the circulatory system: Secondary | ICD-10-CM

## 2024-08-18 DIAGNOSIS — Z0000 Encounter for general adult medical examination without abnormal findings: Secondary | ICD-10-CM | POA: Diagnosis not present

## 2024-08-18 NOTE — Patient Instructions (Addendum)
 Decrease cholesterol in diet... decreased animal fats and  exchange for veggies fat.  Consider stevia in daily oatmeal instead.  Cook  with olive oil instead of butter, cream, cheese.  CALL IF INTERESTED IN LIPOPROTEIN A TEST OR CALCIUM ct CARDIAC SCORE.   Call Dr. Charlanne to set up colonoscopy.   Here is some info I have gathered for you for a trusted medical source. Lipid Management With Diet, Uptodate Feb 20.2021, [place] and [place]  Although earlier, smaller trials suggested a benefit of garlic supplementation, a subsequent larger trial failed to demonstrate improvement in lipids with use of any of three different garlic preparations (raw, powdered, or aged).  Bergamot: Improvements in serum lipids have been reported in trials of patients with metabolic syndrome, nonalcoholic fatty liver disease and in hyperlipidemic patients resistant to statin treatment. However, high-quality data on the effects of bergamot are lacking.  Suggestions for you if you would like to try natural supplements to lower cholesterol.  1.Souble fiber : Psyllium In a meta-analysis of randomized trials of patients with both normal and elevated cholesterol levels, the addition of 10.2 g/day of psyllium lowered the LDL cholesterol by an average of 12.8 mg/dL   2. Omega 3s: Mixed results in studies. Given you triglycerides are normal I would not use this.  3.Red yeast rice ( 2.4 grams divided half in AM half in PM): Red yeast rice is a fermented rice product, most often taken as a supplement, which can improve serum cholesterol  via  method similar to prescription statins.  Red yeast rice supplements lowered total cholesterol (208 versus 251 mg/dL) and LDL cholesterol (864 versus 175 mg/dL) compared with placebo.  4. Plant sterol.. There are naturally occurring sterols and stanols in nuts, legumes, whole grains, fruits, vegetables, and plant oils. In addition, a number of manufactured products enriched with plant  sterols and stanols are commercially available. The margarines containing these compounds (eg, Benecol and Take Control spreads) have been available the longest and are the most studied  In a trial of 150 patients with mild hypercholesterolemia,those consuming the fortified margarine experienced a 10 to 14 percent decrease in total cholesterol and LDL cholesterol.  5.Green Tea Catechins: .n a year-long randomized trial of more than 900 healthy postmenopausal women, green tea catechin supplements (1315 mg catechins/day) reduced total cholesterol and LDL cholesterol, increased triglycerides, and had no effect on HDL cholesterol

## 2024-08-18 NOTE — Progress Notes (Signed)
 Patient ID: Gregory Dorsey, male    DOB: 07/16/1966, 58 y.o.   MRN: [id number]  This visit was conducted in person.  BP 134/82   Pulse 72   Temp 97.7 F (36.5 C) (Temporal)   Ht 5' 11.25 (1.81 m)   Wt 228 lb (103.4 kg)   SpO2 97%   BMI 31.58 kg/m    CC:  Chief Complaint  Patient presents with   Annual Exam    Subjective:   HPI: Gregory Dorsey is a 58 y.o. male presenting on 08/18/2024 for Annual Exam  Elevated Cholesterol:  worsened control of cholesterol Lab Results  Component Value Date   CHOL 209 (H) 08/08/2024   HDL 57.00 08/08/2024   LDLCALC 140 (H) 08/08/2024   LDLDIRECT 117.7 01/19/2013   TRIG 63.0 08/08/2024   CHOLHDL 4 08/08/2024  The 10-year ASCVD risk score (Arnett DK, et al., 2019) is: 7.4%   Values used to calculate the score:     Age: 44 years     Clincally relevant sex: Male     Is Non-Hispanic African American: No     Diabetic: No     Tobacco smoker: No     Systolic Blood Pressure: 134 mmHg     Is BP treated: No     HDL Cholesterol: 57 mg/dL     Total Cholesterol: 209 mg/dL Using medications without problems: none Muscle aches:  none Diet compliance: moderate diet Exercise: walking  2 miles Other complaints:   Wt Readings from Last 3 Encounters:  08/18/24 228 lb (103.4 kg)  08/11/23 214 lb (97.1 kg)  03/31/23 212 lb (96.2 kg)     Prediabetes  Improved in last year. Lab Results  Component Value Date   HGBA1C 5.9 08/08/2024     Flowsheet Row Office Visit from 08/18/2024 in [facility] Total Score 0        Relevant past medical, surgical, family and social history reviewed and updated as indicated. Interim medical history since our last visit reviewed. Allergies and medications reviewed and updated. No outpatient medications prior to visit.   No facility-administered medications prior to visit.     Per HPI unless specifically indicated in ROS section below Review of Systems   Constitutional:  Negative for fatigue and fever.  HENT:  Negative for ear pain.   Eyes:  Negative for pain.  Respiratory:  Negative for cough and shortness of breath.   Cardiovascular:  Negative for chest pain, palpitations and leg swelling.  Gastrointestinal:  Negative for abdominal pain.  Genitourinary:  Negative for dysuria.  Musculoskeletal:  Negative for arthralgias.  Neurological:  Negative for syncope, light-headedness and headaches.  Psychiatric/Behavioral:  Negative for dysphoric mood.    Objective:  BP 134/82   Pulse 72   Temp 97.7 F (36.5 C) (Temporal)   Ht 5' 11.25 (1.81 m)   Wt 228 lb (103.4 kg)   SpO2 97%   BMI 31.58 kg/m   Wt Readings from Last 3 Encounters:  08/18/24 228 lb (103.4 kg)  08/11/23 214 lb (97.1 kg)  03/31/23 212 lb (96.2 kg)      Physical Exam Constitutional:      General: He is not in acute distress.    Appearance: Normal appearance. He is well-developed. He is not ill-appearing or toxic-appearing.  HENT:     Head: Normocephalic and atraumatic.     Right Ear: Hearing, tympanic membrane, ear canal and external ear normal.  Left Ear: Hearing, tympanic membrane, ear canal and external ear normal.     Nose: Nose normal.     Mouth/Throat:     Pharynx: Uvula midline.  Eyes:     General: Lids are normal. Lids are everted, no foreign bodies appreciated.     Conjunctiva/sclera: Conjunctivae normal.     Pupils: Pupils are equal, round, and reactive to light.  Neck:     Thyroid : No thyroid  mass or thyromegaly.     Vascular: No carotid bruit.     Trachea: Trachea and phonation normal.  Cardiovascular:     Rate and Rhythm: Normal rate and regular rhythm.     Pulses: Normal pulses.     Heart sounds: S1 normal and S2 normal. Heart sounds not distant. No murmur heard.    No friction rub. No gallop.     Comments: No peripheral edema Pulmonary:     Effort: Pulmonary effort is normal. No respiratory distress.     Breath sounds: Normal breath  sounds. No wheezing, rhonchi or rales.  Abdominal:     General: Bowel sounds are normal.     Palpations: Abdomen is soft.     Tenderness: There is no abdominal tenderness. There is no guarding or rebound.     Hernia: No hernia is present.  Musculoskeletal:     Cervical back: Normal range of motion and neck supple.  Lymphadenopathy:     Cervical: No cervical adenopathy.  Skin:    General: Skin is warm and dry.     Findings: No rash.  Neurological:     Mental Status: He is alert.     Cranial Nerves: No cranial nerve deficit.     Sensory: No sensory deficit.     Gait: Gait normal.     Deep Tendon Reflexes: Reflexes are normal and symmetric.  Psychiatric:        Speech: Speech normal.        Behavior: Behavior normal.        Thought Content: Thought content normal.        Judgment: Judgment normal.       Results for orders placed or performed in visit on 08/08/24  HIV Antibody (routine testing w rflx)   Collection Time: 08/08/24  7:31 AM  Result Value Ref Range   HIV FINAL INTERPRETATION HIV NEGATIVE    HIV 1&2 Ab, 4th Generation NON-REACTIVE NON-REACTIVE  Hemoglobin A1c   Collection Time: 08/08/24  7:31 AM  Result Value Ref Range   Hgb A1c MFr Bld 5.9 4.6 - 6.5 %  Comprehensive metabolic panel   Collection Time: 08/08/24  7:31 AM  Result Value Ref Range   Sodium 140 135 - 145 mEq/L   Potassium 4.4 3.5 - 5.1 mEq/L   Chloride 104 96 - 112 mEq/L   CO2 28 19 - 32 mEq/L   Glucose, Bld 97 70 - 99 mg/dL   BUN 15 6 - 23 mg/dL   Creatinine, Ser 9.09 0.40 - 1.50 mg/dL   Total Bilirubin 0.8 0.2 - 1.2 mg/dL   Alkaline Phosphatase 69 39 - 117 U/L   AST 18 0 - 37 U/L   ALT 19 0 - 53 U/L   Total Protein 7.1 6.0 - 8.3 g/dL   Albumin 4.9 3.5 - 5.2 g/dL   GFR 05.64 >39.99 mL/min   Calcium 9.5 8.4 - 10.5 mg/dL  PSA   Collection Time: 08/08/24  7:31 AM  Result Value Ref Range   PSA 1.24 0.10 - 4.00  ng/mL  Lipid panel   Collection Time: 08/08/24  7:31 AM  Result Value Ref Range    Cholesterol 209 (H) 0 - 200 mg/dL   Triglycerides 36.9 0.0 - 149.0 mg/dL   HDL 42.99 >60.99 mg/dL   VLDL 87.3 0.0 - 59.9 mg/dL   LDL Cholesterol 859 (H) 0 - 99 mg/dL   Total CHOL/HDL Ratio 4    NonHDL 152.12      COVID 19 screen:  No recent travel or known exposure to COVID19 The patient denies respiratory symptoms of COVID 19 at this time. The importance of social distancing was discussed today.   Assessment and Plan   The patient's preventative maintenance and recommended screening tests for an annual wellness exam were reviewed in full today. Brought up to date unless services declined.  Counselled on the importance of diet, exercise, and its role in overall health and mortality. The patient's FH and SH was reviewed, including their home life, tobacco status, and drug and alcohol status.   Vaccines:uptodate  Tdap,  refused flu, shingrix, hep b and COVID. No family history of early prostate/colon cancer . Colon: 10/2018 colonoscopy, polyp  Dr. Charlanne, repeat 5 years DUE Not interested in STD testing Lab Results  Component Value Date   PSA 1.24 08/08/2024   PSA 1.43 08/04/2023   PSA 0.92 08/07/2022   Non smoker.  No ETOH use.  Problem List Items Addressed This Visit     Family history of early CAD   High cholesterol   Chronic, worsened control in the last year now with 7% 10-year ASCVD risk as well as family history with heart disease in  father at an early age. We discussed consideration of cholesterol medication... he declined.  Offered LipoproteinA and CAC testing.  He will consider.  Info given on natural treatment for cholesterol.   Re-eval with labs in 3 months,         Prediabetes   Chronic, improved control with regular exercise. Continue working on [employer].      Other Visit Diagnoses       Routine general medical examination at a health care facility    -  Primary          Greig Ring, MD

## 2024-08-18 NOTE — Assessment & Plan Note (Addendum)
 Chronic, worsened control in the last year now with 7% 10-year ASCVD risk as well as family history with heart disease in  father at an early age. We discussed consideration of cholesterol medication... he declined.  Offered LipoproteinA and CAC testing.  He will consider.  Info given on natural treatment for cholesterol.   Re-eval with labs in 3 months,

## 2024-08-18 NOTE — Assessment & Plan Note (Signed)
 Chronic, improved control with regular exercise. Continue working on [employer].

## 2024-09-15 ENCOUNTER — Ambulatory Visit (INDEPENDENT_AMBULATORY_CARE_PROVIDER_SITE_OTHER): Admitting: Podiatry

## 2024-09-15 ENCOUNTER — Encounter: Payer: Self-pay | Admitting: Podiatry

## 2024-09-15 DIAGNOSIS — L989 Disorder of the skin and subcutaneous tissue, unspecified: Secondary | ICD-10-CM

## 2024-09-15 DIAGNOSIS — M7751 Other enthesopathy of right foot: Secondary | ICD-10-CM | POA: Diagnosis not present

## 2024-09-15 MED ORDER — TRIAMCINOLONE ACETONIDE 10 MG/ML IJ SUSP
5.0000 mg | Freq: Once | INTRAMUSCULAR | Status: AC
Start: 2024-09-15 — End: 2024-09-15
  Administered 2024-09-15: 5 mg via INTRAMUSCULAR

## 2024-09-15 NOTE — Patient Instructions (Signed)

## 2024-09-18 NOTE — Progress Notes (Signed)
 Subjective:   Patient ID: Gregory Dorsey, male   DOB: 58 y.o.   MRN: [id number]   HPI Chief Complaint  Patient presents with   Callouses    Right lateral foot sub 5th met. Had issue for 10 years. Uses Ped egg for debridement. Non diabetic. 0 pain at the present.   58 year old male presents the office today with the above concerns.  He states he has a area of discomfort on the right foot on the fifth MPJ.  He had it previously and had an injection that shaving about 10 years ago but is coming back.  He tries to manage it at home following.  No pain at this time but does hurt mostly with pressure and walking.  No injuries.   Review of Systems  All other systems reviewed and are negative.  Past Medical History:  Diagnosis Date   Kidney stone    x2   Reflux     Past Surgical History:  Procedure Laterality Date   WRIST SURGERY Left 29 years ago    No current outpatient medications on file.  No Known Allergies        Objective:  Physical Exam  General: AAO x3, NAD  Dermatological: Punctate annular hyperkeratotic lesion noted submetatarsal 5 on the right foot.  There is diffuse callus around this area.  There is no underlying ulceration, drainage or signs of infection.  No foreign body.  Vascular: Dorsalis Pedis artery and Posterior Tibial artery pedal pulses are 2/4 bilateral with immedate capillary fill time. There is no pain with calf compression, swelling, warmth, erythema.   Neruologic: Grossly intact via light touch bilateral.   Musculoskeletal: Localized edema on the fifth MTPJ.  Tenderness palpation along this area.  No areas of pinpoint tenderness.       Assessment:   Capsulitis right foot, hyperkeratotic lesion     Plan:  -Treatment options discussed including all alternatives, risks, and complications -Etiology of symptoms were discussed - Patient was proceed with a steroid injection.  I cleaned the skin with alcohol.  Mixture 1 cc Kenalog  10, 0.5 cc of  Marcaine plain, 0.5 cc of lidocaine plain was infiltrated around the fifth MTPJ.  Tolerated well.  I then sharply debrided hyperkeratotic lesion without any complications or bleeding.  Discussed moisturizer, continue offloading.  Return if symptoms worsen or fail to improve.  Donnice JONELLE Fees DPM

## 2025-08-13 ENCOUNTER — Other Ambulatory Visit

## 2025-08-21 ENCOUNTER — Encounter: Admitting: Family Medicine
